# Patient Record
Sex: Female | Born: 1935 | Race: White | Hispanic: No | Smoking: Never smoker
Health system: Southern US, Community
[De-identification: ages and names within clinical notes are randomized; demographics above are authoritative.]

## PROBLEM LIST (undated history)

## (undated) DIAGNOSIS — I10 Essential (primary) hypertension: Secondary | ICD-10-CM

## (undated) DIAGNOSIS — R42 Dizziness and giddiness: Secondary | ICD-10-CM

---

## 2005-01-16 ENCOUNTER — Ambulatory Visit: Payer: Self-pay | Admitting: Internal Medicine

## 2006-01-19 ENCOUNTER — Ambulatory Visit: Payer: Self-pay | Admitting: Internal Medicine

## 2006-04-02 ENCOUNTER — Encounter: Payer: Self-pay | Admitting: Internal Medicine

## 2006-04-10 ENCOUNTER — Encounter: Payer: Self-pay | Admitting: Internal Medicine

## 2007-08-09 ENCOUNTER — Ambulatory Visit: Payer: Self-pay | Admitting: Internal Medicine

## 2008-03-08 ENCOUNTER — Ambulatory Visit: Payer: Self-pay | Admitting: Internal Medicine

## 2008-08-31 ENCOUNTER — Ambulatory Visit: Payer: Self-pay | Admitting: Internal Medicine

## 2009-09-12 ENCOUNTER — Emergency Department: Payer: Self-pay | Admitting: Emergency Medicine

## 2009-09-12 ENCOUNTER — Ambulatory Visit: Payer: Self-pay | Admitting: Internal Medicine

## 2010-04-05 ENCOUNTER — Emergency Department: Payer: Self-pay | Admitting: Emergency Medicine

## 2015-10-14 ENCOUNTER — Emergency Department: Payer: Medicare Other

## 2015-10-14 ENCOUNTER — Observation Stay
Admission: EM | Admit: 2015-10-14 | Discharge: 2015-10-22 | Payer: Medicare Other | Attending: Internal Medicine | Admitting: Internal Medicine

## 2015-10-14 ENCOUNTER — Encounter: Payer: Self-pay | Admitting: Emergency Medicine

## 2015-10-14 DIAGNOSIS — G2401 Drug induced subacute dyskinesia: Secondary | ICD-10-CM | POA: Diagnosis not present

## 2015-10-14 DIAGNOSIS — R410 Disorientation, unspecified: Secondary | ICD-10-CM | POA: Diagnosis not present

## 2015-10-14 DIAGNOSIS — E46 Unspecified protein-calorie malnutrition: Secondary | ICD-10-CM | POA: Diagnosis not present

## 2015-10-14 DIAGNOSIS — G249 Dystonia, unspecified: Secondary | ICD-10-CM | POA: Diagnosis not present

## 2015-10-14 DIAGNOSIS — E785 Hyperlipidemia, unspecified: Secondary | ICD-10-CM | POA: Diagnosis not present

## 2015-10-14 DIAGNOSIS — F329 Major depressive disorder, single episode, unspecified: Secondary | ICD-10-CM | POA: Insufficient documentation

## 2015-10-14 DIAGNOSIS — R4182 Altered mental status, unspecified: Secondary | ICD-10-CM | POA: Insufficient documentation

## 2015-10-14 DIAGNOSIS — R001 Bradycardia, unspecified: Secondary | ICD-10-CM | POA: Insufficient documentation

## 2015-10-14 DIAGNOSIS — R42 Dizziness and giddiness: Secondary | ICD-10-CM | POA: Diagnosis not present

## 2015-10-14 DIAGNOSIS — I371 Nonrheumatic pulmonary valve insufficiency: Secondary | ICD-10-CM | POA: Diagnosis not present

## 2015-10-14 DIAGNOSIS — N390 Urinary tract infection, site not specified: Secondary | ICD-10-CM | POA: Diagnosis present

## 2015-10-14 DIAGNOSIS — E870 Hyperosmolality and hypernatremia: Secondary | ICD-10-CM | POA: Diagnosis not present

## 2015-10-14 DIAGNOSIS — R531 Weakness: Secondary | ICD-10-CM | POA: Diagnosis present

## 2015-10-14 DIAGNOSIS — I4891 Unspecified atrial fibrillation: Secondary | ICD-10-CM | POA: Diagnosis not present

## 2015-10-14 DIAGNOSIS — R5383 Other fatigue: Secondary | ICD-10-CM | POA: Diagnosis not present

## 2015-10-14 DIAGNOSIS — F0390 Unspecified dementia without behavioral disturbance: Secondary | ICD-10-CM | POA: Diagnosis not present

## 2015-10-14 DIAGNOSIS — G9340 Encephalopathy, unspecified: Principal | ICD-10-CM | POA: Diagnosis present

## 2015-10-14 DIAGNOSIS — Z7982 Long term (current) use of aspirin: Secondary | ICD-10-CM | POA: Insufficient documentation

## 2015-10-14 DIAGNOSIS — R339 Retention of urine, unspecified: Secondary | ICD-10-CM | POA: Insufficient documentation

## 2015-10-14 DIAGNOSIS — T50995A Adverse effect of other drugs, medicaments and biological substances, initial encounter: Secondary | ICD-10-CM | POA: Insufficient documentation

## 2015-10-14 DIAGNOSIS — E876 Hypokalemia: Secondary | ICD-10-CM | POA: Insufficient documentation

## 2015-10-14 DIAGNOSIS — R627 Adult failure to thrive: Secondary | ICD-10-CM | POA: Diagnosis not present

## 2015-10-14 DIAGNOSIS — D649 Anemia, unspecified: Secondary | ICD-10-CM | POA: Insufficient documentation

## 2015-10-14 DIAGNOSIS — Z833 Family history of diabetes mellitus: Secondary | ICD-10-CM | POA: Insufficient documentation

## 2015-10-14 DIAGNOSIS — I071 Rheumatic tricuspid insufficiency: Secondary | ICD-10-CM | POA: Insufficient documentation

## 2015-10-14 DIAGNOSIS — I10 Essential (primary) hypertension: Secondary | ICD-10-CM | POA: Diagnosis not present

## 2015-10-14 DIAGNOSIS — I739 Peripheral vascular disease, unspecified: Secondary | ICD-10-CM | POA: Diagnosis not present

## 2015-10-14 DIAGNOSIS — N3000 Acute cystitis without hematuria: Secondary | ICD-10-CM | POA: Insufficient documentation

## 2015-10-14 DIAGNOSIS — I340 Nonrheumatic mitral (valve) insufficiency: Secondary | ICD-10-CM | POA: Insufficient documentation

## 2015-10-14 HISTORY — DX: Essential (primary) hypertension: I10

## 2015-10-14 HISTORY — DX: Dizziness and giddiness: R42

## 2015-10-14 LAB — CBC WITH DIFFERENTIAL/PLATELET
BASOS PCT: 1 %
Basophils Absolute: 0.1 10*3/uL (ref 0–0.1)
Eosinophils Absolute: 0.1 10*3/uL (ref 0–0.7)
Eosinophils Relative: 1 %
HEMATOCRIT: 38.8 % (ref 35.0–47.0)
HEMOGLOBIN: 12.8 g/dL (ref 12.0–16.0)
LYMPHS PCT: 10 %
Lymphs Abs: 1.1 10*3/uL (ref 1.0–3.6)
MCH: 30.6 pg (ref 26.0–34.0)
MCHC: 33.1 g/dL (ref 32.0–36.0)
MCV: 92.4 fL (ref 80.0–100.0)
MONOS PCT: 10 %
Monocytes Absolute: 1 10*3/uL — ABNORMAL HIGH (ref 0.2–0.9)
Neutro Abs: 8.4 10*3/uL — ABNORMAL HIGH (ref 1.4–6.5)
Neutrophils Relative %: 78 %
Platelets: 229 10*3/uL (ref 150–440)
RBC: 4.19 MIL/uL (ref 3.80–5.20)
RDW: 13.4 % (ref 11.5–14.5)
WBC: 10.7 10*3/uL (ref 3.6–11.0)

## 2015-10-14 LAB — COMPREHENSIVE METABOLIC PANEL
ALT: 14 U/L (ref 14–54)
ANION GAP: 9 (ref 5–15)
AST: 25 U/L (ref 15–41)
Albumin: 3.8 g/dL (ref 3.5–5.0)
Alkaline Phosphatase: 56 U/L (ref 38–126)
BILIRUBIN TOTAL: 0.7 mg/dL (ref 0.3–1.2)
BUN: 30 mg/dL — ABNORMAL HIGH (ref 6–20)
CHLORIDE: 103 mmol/L (ref 101–111)
CO2: 26 mmol/L (ref 22–32)
Calcium: 11.5 mg/dL — ABNORMAL HIGH (ref 8.9–10.3)
Creatinine, Ser: 1.13 mg/dL — ABNORMAL HIGH (ref 0.44–1.00)
GFR, EST AFRICAN AMERICAN: 48 mL/min — AB (ref >60)
GFR, EST NON AFRICAN AMERICAN: 42 mL/min — AB (ref >60)
Glucose, Bld: 99 mg/dL (ref 65–99)
POTASSIUM: 4.2 mmol/L (ref 3.5–5.1)
Sodium: 138 mmol/L (ref 135–145)
TOTAL PROTEIN: 7.2 g/dL (ref 6.5–8.1)

## 2015-10-14 LAB — URINE DRUG SCREEN, QUALITATIVE (ARMC ONLY)
Amphetamines, Ur Screen: NOT DETECTED
BENZODIAZEPINE, UR SCRN: NOT DETECTED
Barbiturates, Ur Screen: NOT DETECTED
CANNABINOID 50 NG, UR ~~LOC~~: NOT DETECTED
Cocaine Metabolite,Ur ~~LOC~~: NOT DETECTED
MDMA (Ecstasy)Ur Screen: NOT DETECTED
Methadone Scn, Ur: NOT DETECTED
Opiate, Ur Screen: NOT DETECTED
PHENCYCLIDINE (PCP) UR S: NOT DETECTED
Tricyclic, Ur Screen: NOT DETECTED

## 2015-10-14 LAB — URINALYSIS COMPLETE WITH MICROSCOPIC (ARMC ONLY)
Bilirubin Urine: NEGATIVE
Glucose, UA: NEGATIVE mg/dL
Hgb urine dipstick: NEGATIVE
KETONES UR: NEGATIVE mg/dL
NITRITE: NEGATIVE
PH: 6 (ref 5.0–8.0)
PROTEIN: NEGATIVE mg/dL
SPECIFIC GRAVITY, URINE: 1.016 (ref 1.005–1.030)

## 2015-10-14 LAB — TROPONIN I: TROPONIN I: 0.05 ng/mL — AB (ref <0.031)

## 2015-10-14 LAB — SALICYLATE LEVEL

## 2015-10-14 LAB — ACETAMINOPHEN LEVEL

## 2015-10-14 LAB — ETHANOL

## 2015-10-14 MED ORDER — HYDROCHLOROTHIAZIDE 25 MG PO TABS: 25.0000 mg | ORAL_TABLET | Freq: Every day | ORAL | Status: DC

## 2015-10-14 MED ORDER — HEPARIN SODIUM (PORCINE) 5000 UNIT/ML IJ SOLN
5000.0000 [IU] | Freq: Three times a day (TID) | INTRAMUSCULAR | Status: DC
  Administered 2015-10-15 – 2015-10-22 (×20): 5000 [IU] via SUBCUTANEOUS
  Filled 2015-10-14 (×19): qty 1

## 2015-10-14 MED ORDER — LISINOPRIL-HYDROCHLOROTHIAZIDE 20-12.5 MG PO TABS: 2.0000 | ORAL_TABLET | Freq: Every morning | ORAL | Status: DC

## 2015-10-14 MED ORDER — MONTELUKAST SODIUM 10 MG PO TABS
10.0000 mg | ORAL_TABLET | Freq: Every day | ORAL | Status: DC
  Administered 2015-10-15 – 2015-10-20 (×4): 10 mg via ORAL
  Filled 2015-10-14 (×7): qty 1

## 2015-10-14 MED ORDER — FLUTICASONE PROPIONATE 50 MCG/ACT NA SUSP
1.0000 | Freq: Every day | NASAL | Status: DC
  Administered 2015-10-19 – 2015-10-20 (×2): 2 via NASAL
  Administered 2015-10-21: 1 via NASAL
  Filled 2015-10-14: qty 16

## 2015-10-14 MED ORDER — VITAMIN C 500 MG PO TABS
500.0000 mg | ORAL_TABLET | Freq: Every day | ORAL | Status: DC
  Administered 2015-10-15: 500 mg via ORAL
  Filled 2015-10-14 (×6): qty 1

## 2015-10-14 MED ORDER — PRAVASTATIN SODIUM 10 MG PO TABS
10.0000 mg | ORAL_TABLET | Freq: Every evening | ORAL | Status: DC
  Administered 2015-10-15: 10 mg via ORAL
  Filled 2015-10-14 (×2): qty 1

## 2015-10-14 MED ORDER — ASPIRIN EC 81 MG PO TBEC
81.0000 mg | DELAYED_RELEASE_TABLET | Freq: Every day | ORAL | Status: DC
  Administered 2015-10-15: 81 mg via ORAL
  Filled 2015-10-14 (×6): qty 1

## 2015-10-14 MED ORDER — DEXTROSE 5 % IV SOLN
1.0000 g | Freq: Once | INTRAVENOUS | Status: AC
  Administered 2015-10-14: 1 g via INTRAVENOUS
  Filled 2015-10-14: qty 10

## 2015-10-14 MED ORDER — OXYCODONE HCL 5 MG PO TABS: 5.0000 mg | ORAL_TABLET | ORAL | Status: DC | PRN

## 2015-10-14 MED ORDER — LISINOPRIL 20 MG PO TABS: 40.0000 mg | ORAL_TABLET | Freq: Every day | ORAL | Status: DC

## 2015-10-14 MED ORDER — DEXTROSE 5 % IV SOLN
1.0000 g | INTRAVENOUS | Status: DC
  Administered 2015-10-15 – 2015-10-18 (×4): 1 g via INTRAVENOUS
  Filled 2015-10-14 (×6): qty 10

## 2015-10-14 MED ORDER — SODIUM CHLORIDE 0.9 % IV SOLN
INTRAVENOUS | Status: DC
  Administered 2015-10-14 – 2015-10-17 (×5): via INTRAVENOUS

## 2015-10-14 MED ORDER — SODIUM CHLORIDE 0.9 % IV BOLUS (SEPSIS)
1000.0000 mL | Freq: Once | INTRAVENOUS | Status: AC
  Administered 2015-10-14: 1000 mL via INTRAVENOUS

## 2015-10-14 MED ORDER — ACETAMINOPHEN 650 MG RE SUPP: 650.0000 mg | Freq: Four times a day (QID) | RECTAL | Status: DC | PRN

## 2015-10-14 MED ORDER — MORPHINE SULFATE (PF) 2 MG/ML IV SOLN: 2.0000 mg | INTRAVENOUS | Status: DC | PRN

## 2015-10-14 MED ORDER — ACETAMINOPHEN 325 MG PO TABS: 650.0000 mg | ORAL_TABLET | Freq: Four times a day (QID) | ORAL | Status: DC | PRN

## 2015-10-14 MED ORDER — ONDANSETRON HCL 4 MG PO TABS: 4.0000 mg | ORAL_TABLET | Freq: Four times a day (QID) | ORAL | Status: DC | PRN

## 2015-10-14 MED ORDER — ONDANSETRON HCL 4 MG/2ML IJ SOLN: 4.0000 mg | Freq: Four times a day (QID) | INTRAMUSCULAR | Status: DC | PRN

## 2015-10-14 NOTE — ED Notes (Signed)
Resting quietly at this time.  Family at bedside. 

## 2015-10-14 NOTE — ED Notes (Signed)
Not eating or drinking as well either

## 2015-10-14 NOTE — H&P (Signed)
[facility]   PATIENT NAME: Ellen Evans    MR#:  [id number]  DATE OF BIRTH:  [birth year]   DATE OF ADMISSION:  10/14/2015  PRIMARY CARE PHYSICIAN: Mickey Farber, MD   REQUESTING/REFERRING PHYSICIAN: McShane  CHIEF COMPLAINT:   Chief Complaint  Patient presents with  . Altered Mental Status    per family more confused today and not wanting to walk anywhere. started this am. no other symptoms    HISTORY OF PRESENT ILLNESS:  Ellen Evans  is a 81 y.o. female with a known history of essential hypertension who was presented with altered mental status. The patient is unable to provide meaningful information given mental status/medical condition history obtained from daughter present at bedside. She describes one day duration of altered mental status described as confusion tiredness as well as generalized weakness. She denies any fevers or chills or further symptomatology at this time. In the emergency department noted to be in atrial fibrillation rate controlled PAST MEDICAL HISTORY:   Past Medical History  Diagnosis Date  . Hypertension     PAST SURGICAL HISTORY:  History reviewed. No pertinent past surgical history.  SOCIAL HISTORY:   Social History  Substance Use Topics  . Smoking status: Never Smoker   . Smokeless tobacco: Not on file  . Alcohol Use: No    FAMILY HISTORY:   Family History  Problem Relation Age of Onset  . Diabetes Neg Hx     DRUG ALLERGIES:  No Known Allergies  REVIEW OF SYSTEMS:  Unable to obtain given patient's mental status/medical condition   MEDICATIONS AT HOME:   Prior to Admission medications   Medication Sig Start Date End Date Taking? Authorizing Provider  aspirin EC 81 MG tablet Take 81 mg by mouth daily.    Yes Historical Provider, MD  calcium-vitamin D ([employer] [facility] CALCIUM-VIT D) 500-200 MG-UNIT tablet Take 1 tablet by mouth daily.   Yes Historical Provider, MD  fluticasone  (FLONASE) 50 MCG/ACT nasal spray Place 1-2 sprays into both nostrils daily.   Yes Historical Provider, MD  lisinopril-hydrochlorothiazide (PRINZIDE,ZESTORETIC) 20-12.5 MG tablet Take 2 tablets by mouth every morning.   Yes Historical Provider, MD  montelukast (SINGULAIR) 10 MG tablet Take 10 mg by mouth at bedtime.    Yes Historical Provider, MD  pravastatin (PRAVACHOL) 10 MG tablet Take 10 mg by mouth every evening.    Yes Historical Provider, MD  vitamin C (ASCORBIC ACID) 500 MG tablet Take 500 mg by mouth daily.    Yes Historical Provider, MD      VITAL SIGNS:  Blood pressure 128/93, pulse 86, temperature 97.7 F (36.5 C), temperature source Oral, resp. rate 16, height  (1.575 m), weight 115 lb (52.164 kg), SpO2 98 %.  PHYSICAL EXAMINATION:  VITAL SIGNS: Filed Vitals:   10/14/15 1942 10/14/15 1954  BP: 128/83 128/93  Pulse:  86  Temp:    Resp:  16   GENERAL:81 y.o.female currently in no acute distress. Frail appearing  HEAD: Normocephalic, atraumatic.  EYES: Pupils equal, round, reactive to light. Extraocular muscles intact. No scleral icterus.  MOUTH: Moist mucosal membrane. Dentition intact. No abscess noted.  EAR, NOSE, THROAT: Clear without exudates. No external lesions.  NECK: Supple. No thyromegaly. No nodules. No JVD.  PULMONARY: Clear to ascultation, without wheeze rails or rhonci. No use of accessory muscles, Good respiratory effort. good air entry bilaterally CHEST: Nontender to palpation.  CARDIOVASCULAR: S1 and S2. Irregular rate and  rhythm. No murmurs, rubs, or gallops. No edema. Pedal pulses 2+ bilaterally.  GASTROINTESTINAL: Soft, nontender, nondistended. No masses. Positive bowel sounds. No hepatosplenomegaly.  MUSCULOSKELETAL: No swelling, clubbing, or edema. Range of motion full in all extremities.  NEUROLOGIC: Unable to accurately assess given patient's mental status/medical condition she does have spontaneous movement of all extremities as well as tracking  with IV movements but unable to follow commands  SKIN: No ulceration, lesions, rashes, or cyanosis. Skin warm and dry. Turgor intact.  PSYCHIATRIC: Mood, affect blunted The patient is awake but unable to answer questions or follow commands Insight, judgment poor.    LABORATORY PANEL:   CBC  Recent Labs Lab 10/14/15 1942  WBC 10.7  HGB 12.8  HCT 38.8  PLT 229   ------------------------------------------------------------------------------------------------------------------  Chemistries   Recent Labs Lab 10/14/15 1942  NA 138  K 4.2  CL 103  CO2 26  GLUCOSE 99  BUN 30*  CREATININE 1.13*  CALCIUM 11.5*  AST 25  ALT 14  ALKPHOS 56  BILITOT 0.7   ------------------------------------------------------------------------------------------------------------------  Cardiac Enzymes  Recent Labs Lab 10/14/15 1942  TROPONINI 0.05*   ------------------------------------------------------------------------------------------------------------------  RADIOLOGY:  Dg Chest 2 View  10/14/2015  CLINICAL DATA:  Acute onset of lethargy and decreased appetite. Initial encounter. EXAM: CHEST  2 VIEW COMPARISON:  Chest radiograph performed 09/12/2009 FINDINGS: The lungs are well-aerated. Minimal bilateral atelectasis is noted. There is no evidence of pleural effusion or pneumothorax. The heart is borderline normal in size. No acute osseous abnormalities are seen. IMPRESSION: Minimal bilateral atelectasis noted.  Lungs otherwise clear. Electronically Signed   By: Roanna RaiderJeffery  Chang M.D.   On: 10/14/2015 18:22   Ct Head Wo Contrast  10/14/2015  CLINICAL DATA:  Worsening confusion and poor mobility, acute onset. Initial encounter. EXAM: CT HEAD WITHOUT CONTRAST TECHNIQUE: Contiguous axial images were obtained from the base of the skull through the vertex without intravenous contrast. COMPARISON:  None. FINDINGS: There is no evidence of acute infarction, mass lesion, or intra- or extra-axial  hemorrhage on CT. Prominence of the ventricles and sulci reflects moderate cortical volume loss. Mild cerebellar atrophy is noted. Scattered periventricular and subcortical white matter change likely reflects small vessel ischemic microangiopathy. The brainstem and fourth ventricle are within normal limits. The basal ganglia are unremarkable in appearance. The cerebral hemispheres demonstrate grossly normal gray-white differentiation. No mass effect or midline shift is seen. There is no evidence of fracture; visualized osseous structures are unremarkable in appearance. The visualized portions of the orbits are within normal limits. The paranasal sinuses and mastoid air cells are well-aerated. No significant soft tissue abnormalities are seen. IMPRESSION: 1. No acute intracranial pathology seen on CT. 2. Moderate cortical volume loss and scattered small vessel ischemic microangiopathy. Electronically Signed   By: Roanna RaiderJeffery  Chang M.D.   On: 10/14/2015 19:01    EKG:   Orders placed or performed during the hospital encounter of 10/14/15  . ED EKG  . ED EKG  . EKG 12-Lead  . EKG 12-Lead    IMPRESSION AND PLAN:   81 year old Caucasian female history of essential hypertension presenting with altered mental status  1. Encephalopathy, metabolic: Multifactorial however urinary tract infection site unspecified most likely culprit follow culture data, continue ceftriaxone as started in emergency department as well as noted hypercalcemia 2. Hypercalcemia has had a history of this: We'll hold her calcium supplements as well as provide IV fluid hydration 3. Essential hypertension: Continue lisinopril/hydrochlorothiazide 4. Atrial fibrillation, apparent new onset: Check TSH, trend enzymes, telemetry,  echocardiogram I have discussed risks and benefits of starting anticoagulation feel at this time risk outweigh benefit as she is altered and already unsteady on her feet. Daughter agrees 5. Venous thromboembolism  prophylactic: Heparin subcutaneous    All the records are reviewed and case discussed with ED provider. Management plans discussed with the patient, family and they are in agreement.  CODE STATUS: Full  TOTAL TIME TAKING CARE OF THIS PATIENT: 35 minutes.    Hower,  Mardi Mainland.D on 10/14/2015 at 10:23 PM  Between 7am to 6pm - Pager - [phone]  After 6pm: House Pager: - [phone]  [facility]  Office  [phone]  CC: Primary care physician; Mickey Farber, MD

## 2015-10-14 NOTE — ED Provider Notes (Signed)
[facility] Emergency Department Provider Note  ____________________________________________   I have reviewed the triage vital signs and the nursing notes.   HISTORY  Chief Complaint Altered Mental Status    HPI Ellen Evans is a 81 y.o. female with a history of mild dementia, who is normally quite alert but has been getting gradually weaker over the last several months with increasing confusion has an acute change in her status today apparently. Today, she is much more weak than normal generally, no focal weakness. She has been less talkative than normal, less oriented than normal, and when they walk she will try to sit down. There is been no complaints of fever or vomiting. Patient does not eat or drink very much. There is been no complaint of fall or closed head injury. She actually lives by herself but her daughter his neck or with her. Patient herself cannot give a history.  No past medical history on file.  There are no active problems to display for this patient.   No past surgical history on file.  No current outpatient prescriptions on file.  Allergies Review of patient's allergies indicates no known allergies.  No family history on file.  Social History Social History  Substance Use Topics  . Smoking status: Never Smoker   . Smokeless tobacco: Not on file  . Alcohol Use: No    Review of Systems Cannot obtain second to patient mental status  ____________________________________________   PHYSICAL EXAM:  VITAL SIGNS: ED Triage Vitals  Enc Vitals Group     BP 10/14/15 1626 100/62 mmHg     Pulse Rate 10/14/15 1626 108     Resp 10/14/15 1954 16     Temp 10/14/15 1626 97.7 F (36.5 C)     Temp Source 10/14/15 1626 Oral     SpO2 10/14/15 1626 95 %     Weight 10/14/15 1626 115 lb (52.164 kg)     Height 10/14/15 1626  (1.575 m)     Head Cir --      Peak Flow --      Pain Score --      Pain Loc --      Pain Edu? --    Excl. in GC? --     Constitutional: Alert elderly female who hard of hearing and somewhat confused. She can tell me her name but cannot tell me her daughter's name and does not know where she is or the date Eyes: Conjunctivae are normal. PERRL. EOMI. Head: Atraumatic. Nose: No congestion/rhinnorhea. Mouth/Throat: Mucous membranes are dry.  Oropharynx non-erythematous. Neck: No stridor.   Nontender with no meningismus Cardiovascular: Normal rate, regular rhythm. Grossly normal heart sounds.  Good peripheral circulation. Respiratory: Normal respiratory effort.  No retractions. Lungs CTAB. Abdominal: Soft and nontender. No distention. No guarding no rebound Back:  There is no focal tenderness or step off there is no midline tenderness there are no lesions noted. there is no CVA tenderness Musculoskeletal: No lower extremity tenderness. No joint effusions, no DVT signs strong distal pulses no edema Neurologic:  Normal speech and language. No gross focal neurologic deficits are appreciated.  Skin:  Skin is warm, dry and intact. No rash noted. Psychiatric: Mood and affect are normal. Speech and behavior are normal.  ____________________________________________   LABS (all labs ordered are listed, but only abnormal results are displayed)  Labs Reviewed  COMPREHENSIVE METABOLIC PANEL - Abnormal; Notable for the following:    BUN 30 (*)    Creatinine,  Ser 1.13 (*)    Calcium 11.5 (*)    GFR calc non Af Amer 42 (*)    GFR calc Af Amer 48 (*)    All other components within normal limits  TROPONIN I - Abnormal; Notable for the following:    Troponin I 0.05 (*)    All other components within normal limits  ACETAMINOPHEN LEVEL - Abnormal; Notable for the following:    Acetaminophen (Tylenol), Serum <10 (*)    All other components within normal limits  CBC WITH DIFFERENTIAL/PLATELET - Abnormal; Notable for the following:    Neutro Abs 8.4 (*)    Monocytes Absolute 1.0 (*)    All other  components within normal limits  URINALYSIS COMPLETEWITH MICROSCOPIC ([facility] ONLY) - Abnormal; Notable for the following:    Color, Urine YELLOW (*)    APPearance HAZY (*)    Leukocytes, UA 1+ (*)    Bacteria, UA RARE (*)    Squamous Epithelial / LPF 0-5 (*)    All other components within normal limits  URINE CULTURE  ETHANOL  SALICYLATE LEVEL  URINE DRUG SCREEN, QUALITATIVE ([facility] ONLY)   ____________________________________________  EKG  I personally interpreted any EKGs ordered by me or triage Likely atrial flutter with a 2 to one block rate 83 possibility of heart block is entertained but think not likely. Patient with no acute ST elevation or depression normal axis and nonspecific ST changes ____________________________________________  RADIOLOGY  I reviewed any imaging ordered by me or triage that were performed during my shift ____________________________________________   PROCEDURES  Procedure(s) performed: None  Critical Care performed: None  ____________________________________________   INITIAL IMPRESSION / ASSESSMENT AND PLAN / ED COURSE  Pertinent labs & imaging results that were available during my care of the patient were reviewed by me and considered in my medical decision making (see chart for details).  Patient with increasing confusion over baseline increasing weakness over baseline decreasing by mouth or her baseline, vital signs are reassuring blood work is thus far reassuring, evidence of a urinary tract infection is noted. Troponin is 0.05. She is unable to tell me if she is having chest pain which embolus but there is no acute ischemic changes. EKG shows what appears to be a likely flutter although heart block is possible I do not think it is the correct diagnosis. Patient will require further evaluation for her worsening symptoms as she lives alone and cannot go home and she has had acute change in her mental status. There is a questionable UTI and we  will treat it. ____________________________________________   FINAL CLINICAL IMPRESSION(S) / ED DIAGNOSES  Final diagnoses:  None     Jeanmarie PlantJames A McShane, MD 10/14/15 2116

## 2015-10-14 NOTE — ED Notes (Signed)
Report called to floor, given to [place].

## 2015-10-15 ENCOUNTER — Inpatient Hospital Stay
Admit: 2015-10-15 | Discharge: 2015-10-15 | Disposition: A | Discharge status: Home / Self Care | Payer: Medicare Other | Attending: Internal Medicine | Admitting: Internal Medicine

## 2015-10-15 DIAGNOSIS — R410 Disorientation, unspecified: Secondary | ICD-10-CM | POA: Diagnosis not present

## 2015-10-15 DIAGNOSIS — G9340 Encephalopathy, unspecified: Secondary | ICD-10-CM | POA: Diagnosis present

## 2015-10-15 LAB — BASIC METABOLIC PANEL
Anion gap: 5 (ref 5–15)
BUN: 25 mg/dL — ABNORMAL HIGH (ref 6–20)
CALCIUM: 10 mg/dL (ref 8.9–10.3)
CO2: 26 mmol/L (ref 22–32)
CREATININE: 0.95 mg/dL (ref 0.44–1.00)
Chloride: 109 mmol/L (ref 101–111)
GFR calc non Af Amer: 52 mL/min — ABNORMAL LOW (ref >60)
GFR, EST AFRICAN AMERICAN: 60 mL/min — AB (ref >60)
GLUCOSE: 87 mg/dL (ref 65–99)
Potassium: 3.4 mmol/L — ABNORMAL LOW (ref 3.5–5.1)
Sodium: 140 mmol/L (ref 135–145)

## 2015-10-15 LAB — TROPONIN I
TROPONIN I: 0.04 ng/mL — AB (ref <0.031)
Troponin I: 0.04 ng/mL — ABNORMAL HIGH (ref <0.031)
Troponin I: 0.04 ng/mL — ABNORMAL HIGH (ref <0.031)

## 2015-10-15 LAB — CBC
HCT: 31.9 % — ABNORMAL LOW (ref 35.0–47.0)
Hemoglobin: 10.8 g/dL — ABNORMAL LOW (ref 12.0–16.0)
MCH: 31.1 pg (ref 26.0–34.0)
MCHC: 33.9 g/dL (ref 32.0–36.0)
MCV: 91.8 fL (ref 80.0–100.0)
PLATELETS: 190 10*3/uL (ref 150–440)
RBC: 3.48 MIL/uL — AB (ref 3.80–5.20)
RDW: 13.1 % (ref 11.5–14.5)
WBC: 6.6 10*3/uL (ref 3.6–11.0)

## 2015-10-15 LAB — TSH: TSH: 4.441 u[IU]/mL (ref 0.350–4.500)

## 2015-10-15 MED ORDER — MIRTAZAPINE 15 MG PO TABS
30.0000 mg | ORAL_TABLET | Freq: Every day | ORAL | Status: DC
  Administered 2015-10-15: 30 mg via ORAL
  Filled 2015-10-15: qty 2

## 2015-10-15 NOTE — Progress Notes (Signed)
*  PRELIMINARY RESULTS* Echocardiogram 2D Echocardiogram has been performed.  Georgann HousekeeperJerry R Hege 10/15/2015, 2:07 PM

## 2015-10-15 NOTE — Consult Note (Signed)
[facility] Face-to-Face Psychiatry Consult   Reason for Consult:  Consult for this 81 year old woman who was brought to the hospital by her family because of a couple days of rapid change in mental status. Consult for "major depression" Referring Physician:  Volanda Napoleon Patient Identification: Ellen Evans MRN:  [id number] Principal Diagnosis: Delirium Diagnosis:   Patient Active Problem List   Diagnosis Date Noted  . Encephalopathy acute [G93.40] 10/15/2015  . Acute delirium [R41.0] 10/15/2015  . Encephalopathy [G93.40] 10/14/2015  . UTI (lower urinary tract infection) [N39.0] 10/14/2015  . Atrial fibrillation ([place]) [I48.91] 10/14/2015    Total Time spent with patient: 1 hour  Subjective:   Ellen Evans is a 81 y.o. female patient admitted with patient is not able to give any information on her own.  HPI:  Information from the patient's family and the chart and from observation of the patient. This 81 year old woman was brought to the hospital by her family because of mental status changes. The daughter who is present today is the one who brought her in. She says that over the course of just one day there was a dramatic change in her mother's behavior. She started acting more confused. At baseline she is able to feed herself independently and suddenly she was not able to do it. At baseline she is able to do her own toileting and suddenly she was not able to do it. She is never very conversant but she seemed to become even more confused and less talkative than usual. It was observed by other treatment team members that the patient was confused and very uncommunicative earlier. Possible consideration of depression as an etiology. The daughter and granddaughter who are present note that the patient has seemed to be dwindling over the last few years since her husband passed away. Also that at times she seems a little bit down but they don't have any specific evidence of sadness or tearfulness there is no talk  about suicidal ideation.  Social history: Patient actually lives independently but with immediate family next-door who check in on her regularly. Husband died a few years ago. Supportive family.  Medical history: History of atrial fibrillation urinary tract infections but overall it sounds like she tends not to patronize medical care very much. The daughter describes a situation that occurred earlier this year in which the patient began to have very low blood pressure. The daughter made the connection that the lower her blood pressure was the more delirious her mother would be. When blood pressure medicines were cut back her mental status improved dramatically.  Substance abuse history: No history of current or past substance abuse  Past Psychiatric History: No history of past psychiatric problems. Never been seen by a psychiatrist never prescribed any medicine for psychiatric needs never been in a psychiatric hospital no history of suicidality.  Risk to Self: Is patient at risk for suicide?: No Risk to Others:   Prior Inpatient Therapy:   Prior Outpatient Therapy:    Past Medical History:  Past Medical History  Diagnosis Date  . Hypertension   . Vertigo    History reviewed. No pertinent past surgical history. Family History:  Family History  Problem Relation Age of Onset  . Diabetes Neg Hx    Family Psychiatric  History: No known family history of mental health or substance abuse problems Social History:  History  Alcohol Use No     History  Drug Use Not on file    Social History   Social History  .  Marital Status: Widowed    Spouse Name: N/A  . Number of Children: N/A  . Years of Education: N/A   Social History Main Topics  . Smoking status: Never Smoker   . Smokeless tobacco: None  . Alcohol Use: No  . Drug Use: None  . Sexual Activity: Not Asked   Other Topics Concern  . None   Social History Narrative  . None   Additional Social History:                           Allergies:  No Known Allergies  Labs:  Results for orders placed or performed during the hospital encounter of 10/14/15 (from the past 48 hour(s))  Urine culture     Status: None (Preliminary result)   Collection Time: 10/14/15  5:45 PM  Result Value Ref Range   Specimen Description URINE, RANDOM    Special Requests NONE    Culture TOO YOUNG TO READ    Report Status PENDING   Urinalysis complete, with microscopic     Status: Abnormal   Collection Time: 10/14/15  5:45 PM  Result Value Ref Range   Color, Urine YELLOW (A) YELLOW   APPearance HAZY (A) CLEAR   Glucose, UA NEGATIVE NEGATIVE mg/dL   Bilirubin Urine NEGATIVE NEGATIVE   Ketones, ur NEGATIVE NEGATIVE mg/dL   Specific Gravity, Urine 1.016 1.005 - 1.030   Hgb urine dipstick NEGATIVE NEGATIVE   pH 6.0 5.0 - 8.0   Protein, ur NEGATIVE NEGATIVE mg/dL   Nitrite NEGATIVE NEGATIVE   Leukocytes, UA 1+ (A) NEGATIVE   RBC / HPF 0-5 0 - 5 RBC/hpf   WBC, UA 6-30 0 - 5 WBC/hpf   Bacteria, UA RARE (A) NONE SEEN   Squamous Epithelial / LPF 0-5 (A) NONE SEEN   Mucous PRESENT    Hyaline Casts, UA PRESENT   Urine Drug Screen, Qualitative     Status: None   Collection Time: 10/14/15  5:45 PM  Result Value Ref Range   Tricyclic, Ur Screen NONE DETECTED NONE DETECTED   Amphetamines, Ur Screen NONE DETECTED NONE DETECTED   MDMA (Ecstasy)Ur Screen NONE DETECTED NONE DETECTED   Cocaine Metabolite,Ur [place] NONE DETECTED NONE DETECTED   Opiate, Ur Screen NONE DETECTED NONE DETECTED   Phencyclidine (PCP) Ur S NONE DETECTED NONE DETECTED   Cannabinoid 50 Ng, Ur [place] NONE DETECTED NONE DETECTED   Barbiturates, Ur Screen NONE DETECTED NONE DETECTED   Benzodiazepine, Ur Scrn NONE DETECTED NONE DETECTED   Methadone Scn, Ur NONE DETECTED NONE DETECTED    Comment: (NOTE) 614  Tricyclics, urine               Cutoff 1000 ng/mL 200  Amphetamines, urine             Cutoff 1000 ng/mL 300  MDMA (Ecstasy), urine           Cutoff 500  ng/mL 400  Cocaine Metabolite, urine       Cutoff 300 ng/mL 500  Opiate, urine                   Cutoff 300 ng/mL 600  Phencyclidine (PCP), urine      Cutoff 25 ng/mL 700  Cannabinoid, urine              Cutoff 50 ng/mL 800  Barbiturates, urine             Cutoff 200 ng/mL 900  Benzodiazepine, urine           Cutoff 200 ng/mL 1000 Methadone, urine                Cutoff 300 ng/mL 1100 1200 The urine drug screen provides only a preliminary, unconfirmed 1300 analytical test result and should not be used for non-medical 1400 purposes. Clinical consideration and professional judgment should 1500 be applied to any positive drug screen result due to possible 1600 interfering substances. A more specific alternate chemical method 1700 must be used in order to obtain a confirmed analytical result.  1800 Gas chromato graphy / mass spectrometry (GC/MS) is the preferred 1900 confirmatory method.   Comprehensive metabolic panel     Status: Abnormal   Collection Time: 10/14/15  7:42 PM  Result Value Ref Range   Sodium 138 135 - 145 mmol/L   Potassium 4.2 3.5 - 5.1 mmol/L    Comment: HEMOLYSIS AT THIS LEVEL MAY AFFECT RESULT   Chloride 103 101 - 111 mmol/L   CO2 26 22 - 32 mmol/L   Glucose, Bld 99 65 - 99 mg/dL   BUN 30 (H) 6 - 20 mg/dL   Creatinine, Ser 1.13 (H) 0.44 - 1.00 mg/dL   Calcium 11.5 (H) 8.9 - 10.3 mg/dL   Total Protein 7.2 6.5 - 8.1 g/dL   Albumin 3.8 3.5 - 5.0 g/dL   AST 25 15 - 41 U/L   ALT 14 14 - 54 U/L   Alkaline Phosphatase 56 38 - 126 U/L   Total Bilirubin 0.7 0.3 - 1.2 mg/dL   GFR calc non Af Amer 42 (L) >60 mL/min   GFR calc Af Amer 48 (L) >60 mL/min    Comment: (NOTE) The eGFR has been calculated using the CKD EPI equation. This calculation has not been validated in all clinical situations. eGFR's persistently <60 mL/min signify possible Chronic Kidney Disease.    Anion gap 9 5 - 15  Ethanol     Status: None   Collection Time: 10/14/15  7:42 PM  Result Value Ref  Range   Alcohol, Ethyl (B) <5 <5 mg/dL    Comment:        LOWEST DETECTABLE LIMIT FOR SERUM ALCOHOL IS 5 mg/dL FOR MEDICAL PURPOSES ONLY   Troponin I     Status: Abnormal   Collection Time: 10/14/15  7:42 PM  Result Value Ref Range   Troponin I 0.05 (H) <0.031 ng/mL    Comment: READ BACK AND VERIFIED WITH Mali VANDYKE ON 10/14/15 AT 2047PM BY TB.        PERSISTENTLY INCREASED TROPONIN VALUES IN THE RANGE OF 0.04-0.49 ng/mL CAN BE SEEN IN:       -UNSTABLE ANGINA       -CONGESTIVE HEART FAILURE       -MYOCARDITIS       -CHEST TRAUMA       -ARRYHTHMIAS       -LATE PRESENTING MYOCARDIAL INFARCTION       -COPD   CLINICAL FOLLOW-UP RECOMMENDED.   Salicylate level     Status: None   Collection Time: 10/14/15  7:42 PM  Result Value Ref Range   Salicylate Lvl <5.3 2.8 - 30.0 mg/dL  Acetaminophen level     Status: Abnormal   Collection Time: 10/14/15  7:42 PM  Result Value Ref Range   Acetaminophen (Tylenol), Serum <10 (L) 10 - 30 ug/mL    Comment:        THERAPEUTIC CONCENTRATIONS VARY SIGNIFICANTLY. A RANGE OF  10-30 ug/mL MAY BE AN EFFECTIVE CONCENTRATION FOR MANY PATIENTS. HOWEVER, SOME ARE BEST TREATED AT CONCENTRATIONS OUTSIDE THIS RANGE. ACETAMINOPHEN CONCENTRATIONS >150 ug/mL AT 4 HOURS AFTER INGESTION AND >50 ug/mL AT 12 HOURS AFTER INGESTION ARE OFTEN ASSOCIATED WITH TOXIC REACTIONS.   CBC with Differential     Status: Abnormal   Collection Time: 10/14/15  7:42 PM  Result Value Ref Range   WBC 10.7 3.6 - 11.0 K/uL   RBC 4.19 3.80 - 5.20 MIL/uL   Hemoglobin 12.8 12.0 - 16.0 g/dL   HCT 38.8 35.0 - 47.0 %   MCV 92.4 80.0 - 100.0 fL   MCH 30.6 26.0 - 34.0 pg   MCHC 33.1 32.0 - 36.0 g/dL   RDW 13.4 11.5 - 14.5 %   Platelets 229 150 - 440 K/uL   Neutrophils Relative % 78 %   Neutro Abs 8.4 (H) 1.4 - 6.5 K/uL   Lymphocytes Relative 10 %   Lymphs Abs 1.1 1.0 - 3.6 K/uL   Monocytes Relative 10 %   Monocytes Absolute 1.0 (H) 0.2 - 0.9 K/uL   Eosinophils Relative  1 %   Eosinophils Absolute 0.1 0 - 0.7 K/uL   Basophils Relative 1 %   Basophils Absolute 0.1 0 - 0.1 K/uL  Troponin I (q 6hr x 3)     Status: Abnormal   Collection Time: 10/15/15 12:12 AM  Result Value Ref Range   Troponin I 0.04 (H) <0.031 ng/mL    Comment: PREVIOUS RESULT CALLED BY TB AT 2047 10/14/15 WDM        PERSISTENTLY INCREASED TROPONIN VALUES IN THE RANGE OF 0.04-0.49 ng/mL CAN BE SEEN IN:       -UNSTABLE ANGINA       -CONGESTIVE HEART FAILURE       -MYOCARDITIS       -CHEST TRAUMA       -ARRYHTHMIAS       -LATE PRESENTING MYOCARDIAL INFARCTION       -COPD   CLINICAL FOLLOW-UP RECOMMENDED.   TSH     Status: None   Collection Time: 10/15/15 12:12 AM  Result Value Ref Range   TSH 4.441 0.350 - 4.500 uIU/mL  Basic metabolic panel     Status: Abnormal   Collection Time: 10/15/15  5:37 AM  Result Value Ref Range   Sodium 140 135 - 145 mmol/L   Potassium 3.4 (L) 3.5 - 5.1 mmol/L   Chloride 109 101 - 111 mmol/L   CO2 26 22 - 32 mmol/L   Glucose, Bld 87 65 - 99 mg/dL   BUN 25 (H) 6 - 20 mg/dL   Creatinine, Ser 0.95 0.44 - 1.00 mg/dL   Calcium 10.0 8.9 - 10.3 mg/dL   GFR calc non Af Amer 52 (L) >60 mL/min   GFR calc Af Amer 60 (L) >60 mL/min    Comment: (NOTE) The eGFR has been calculated using the CKD EPI equation. This calculation has not been validated in all clinical situations. eGFR's persistently <60 mL/min signify possible Chronic Kidney Disease.    Anion gap 5 5 - 15  CBC     Status: Abnormal   Collection Time: 10/15/15  5:37 AM  Result Value Ref Range   WBC 6.6 3.6 - 11.0 K/uL   RBC 3.48 (L) 3.80 - 5.20 MIL/uL   Hemoglobin 10.8 (L) 12.0 - 16.0 g/dL   HCT 31.9 (L) 35.0 - 47.0 %   MCV 91.8 80.0 - 100.0 fL   MCH 31.1 26.0 -  34.0 pg   MCHC 33.9 32.0 - 36.0 g/dL   RDW 13.1 11.5 - 14.5 %   Platelets 190 150 - 440 K/uL  Troponin I (q 6hr x 3)     Status: Abnormal   Collection Time: 10/15/15  5:37 AM  Result Value Ref Range   Troponin I 0.04 (H) <0.031  ng/mL    Comment: PREVIOUS RESULT CALLED TO Mali VANDYKE ON 10/14/15 AT 2047 BY TB/SRC        PERSISTENTLY INCREASED TROPONIN VALUES IN THE RANGE OF 0.04-0.49 ng/mL CAN BE SEEN IN:       -UNSTABLE ANGINA       -CONGESTIVE HEART FAILURE       -MYOCARDITIS       -CHEST TRAUMA       -ARRYHTHMIAS       -LATE PRESENTING MYOCARDIAL INFARCTION       -COPD   CLINICAL FOLLOW-UP RECOMMENDED.   Troponin I (q 6hr x 3)     Status: Abnormal   Collection Time: 10/15/15 12:06 PM  Result Value Ref Range   Troponin I 0.04 (H) <0.031 ng/mL    Comment: PREVIOUS RESULT CALLED TO Mali VANDYKE ON 10/14/15 AT 2047 BY TB/SRC        PERSISTENTLY INCREASED TROPONIN VALUES IN THE RANGE OF 0.04-0.49 ng/mL CAN BE SEEN IN:       -UNSTABLE ANGINA       -CONGESTIVE HEART FAILURE       -MYOCARDITIS       -CHEST TRAUMA       -ARRYHTHMIAS       -LATE PRESENTING MYOCARDIAL INFARCTION       -COPD   CLINICAL FOLLOW-UP RECOMMENDED.     Current Facility-Administered Medications  Medication Dose Route Frequency Provider Last Rate Last Dose  . 0.9 %  sodium chloride infusion   Intravenous Continuous Lytle Butte, MD 75 mL/hr at 10/15/15 0550    . acetaminophen (TYLENOL) tablet 650 mg  650 mg Oral Q6H PRN Lytle Butte, MD       Or  . acetaminophen (TYLENOL) suppository 650 mg  650 mg Rectal Q6H PRN Lytle Butte, MD      . aspirin EC tablet 81 mg  81 mg Oral Daily Lytle Butte, MD   81 mg at 10/15/15 1040  . cefTRIAXone (ROCEPHIN) 1 g in dextrose 5 % 50 mL IVPB  1 g Intravenous Q24H Lytle Butte, MD   1 g at 10/15/15 0145  . fluticasone (FLONASE) 50 MCG/ACT nasal spray 1-2 spray  1-2 spray Each Nare Daily Lytle Butte, MD   1 spray at 10/15/15 1000  . heparin injection 5,000 Units  5,000 Units Subcutaneous 3 times per day Lytle Butte, MD   5,000 Units at 10/15/15 0600  . mirtazapine (REMERON) tablet 30 mg  30 mg Oral QHS Aldean Jewett, MD      . montelukast (SINGULAIR) tablet 10 mg  10 mg Oral QHS Lytle Butte, MD   10 mg at 10/15/15 0012  . ondansetron (ZOFRAN) tablet 4 mg  4 mg Oral Q6H PRN Lytle Butte, MD       Or  . ondansetron [facility]) injection 4 mg  4 mg Intravenous Q6H PRN Lytle Butte, MD      . pravastatin (PRAVACHOL) tablet 10 mg  10 mg Oral QPM Lytle Butte, MD   10 mg at 10/15/15 1800  . vitamin C (ASCORBIC ACID) tablet 500  mg  500 mg Oral Daily Lytle Butte, MD   500 mg at 10/15/15 1040    Musculoskeletal: Strength & Muscle Tone: decreased Gait & Station: unable to stand Patient leans: N/A  Psychiatric Specialty Exam: Review of Systems  Unable to perform ROS: mental acuity    Blood pressure 95/49, pulse 63, temperature 98.3 F (36.8 C), temperature source Oral, resp. rate 18, height _0  (1.575 m), weight 52.617 kg (116 lb), SpO2 98 %.Body mass index is 21.21 kg/(m^2).  General Appearance: Disheveled  Eye Contact::  Minimal  Speech:  Garbled, Slow and Slurred  Volume:  Decreased  Mood:  Euthymic  Affect:  Flat  Thought Process:  Disorganized and Tangential  Orientation:  Negative  Thought Content:  Negative  Suicidal Thoughts:  No  Homicidal Thoughts:  No  Memory:  Immediate;   Poor Recent;   Poor Remote;   Poor  Judgement:  Impaired  Insight:  Lacking  Psychomotor Activity:  Decreased and Mannerisms  Concentration:  Poor  Recall:  Poor  Fund of Knowledge:Poor  Language: Poor  Akathisia:  Negative  Handed:  Right  AIMS (if indicated):     Assets:  Resilience Social Support  ADL's:  Impaired  Cognition: Impaired,  Moderate  Sleep:      Treatment Plan Summary: Plan This is an 81 year old woman with no past psychiatric history. The time course they're describing of acute onset of confusion and inability to do basic ADLs is not consistent really with major depression. Although they talk about how she had been dwindling and seemed down at times there is not a history that would be clear for her depression. Patient is currently very confused. Doesn't  know where she is. Picking at her clothing around her. The family report that this is still not her baseline but is improved over where she was earlier today. I think this is primarily a delirium. Related to any one of a number of possible medical illnesses. Compounded by hospital confusion. I would not presume to add any antidepressant medicine. As long as she is not getting more agitated I would also not add any antipsychotic. I don't want to risk making things worse for her or causing unnecessary side effects. All of this was explained to the family who are completely understanding of it. I will follow up tomorrow.  Disposition: Patient does not meet criteria for psychiatric inpatient admission.  Kelina Beauchamp 10/15/2015 8:34 PM

## 2015-10-15 NOTE — Progress Notes (Addendum)
Nonverbal. Pt refused to take meds. Family at the bedside. ECHO completed. Refused to take meds. Incontinent. Pt has not showed any signs of pain. No further concerns at this time.

## 2015-10-15 NOTE — Care Management (Signed)
Spoke with patient's daughter Ellen Evans  and granddaughter.  Patient has had increasing confusion over the past few months.  Family has an appointment for dementia work up 12/13.  Patient is currently open to a home health agency that is providing physical therapy and nursing but not sure of name of agency.  Kathy stays with patient during the day.  Patient is alone at night but daughter Ellen Evans who lives next door check on patient around 3:30 AM.  Physical therapy and psych consults are pending.  Discussed concern that patient with dementia- that is getting more confused most likely should not be left alone.  Kathy verbalizes understanding.

## 2015-10-15 NOTE — Care Management Important Message (Signed)
Important Message  Patient Details  Name: Ellen Evans MRN: [id number] Date of Birth: 03-09-1924   Medicare Important Message Given:  Yes    Orel Cooler A, RN 10/15/2015, 9:56 AM

## 2015-10-15 NOTE — BH Assessment (Signed)
Received phone call for Psych Consult. Information forwarded to on call Psychiatrist (Dr. Clapacs). 

## 2015-10-15 NOTE — Progress Notes (Signed)
Patient arrived to 2A Room 259. Patient remains confused, but cooperative and all questions from daughter answered. Patient and family oriented to unit and Fall Safety Plan signed by daughter due to patient's mental status. Skin assessment completed with Avel SensorAdrienne RN. Nursing staff will continue to monitor. Lamonte RicherKara A Jkai Arwood, RN

## 2015-10-15 NOTE — Progress Notes (Signed)
[facility] [place] at [facility]   PATIENT NAME: Ellen Evans    MR#:  [id number]  DATE OF BIRTH:  1924/07/06  SUBJECTIVE:  CHIEF COMPLAINT:   Chief Complaint  Patient presents with  . Altered Mental Status    per family more confused today and not wanting to walk anywhere. started this am. no other symptoms   Seen twice today once in the morning and once in the evening. Family reports that she is more interactive in the afternoon. She was reluctant to speak to me on both occasions. Remained curled up in the bed refuse to move or participates in examination.  REVIEW OF SYSTEMS:   ROS unable to obtain  DRUG ALLERGIES:  No Known Allergies  VITALS:  Blood pressure 105/68, pulse 60, temperature 97.9 F (36.6 C), temperature source Oral, resp. rate 18, height  (1.575 m), weight 52.617 kg (116 lb), SpO2 97 %.  PHYSICAL EXAMINATION:  GENERAL:  81 y.o.-year-old patient lying on her right side, no distress EYES: Holds eyes closed  HEENT: Head atraumatic, normocephalic. Oropharynx and nasopharynx clear.  NECK:  Supple, no jugular venous distention. No thyroid enlargement, no tenderness.  LUNGS: Normal breath sounds bilaterally, no wheezing, rales,rhonchi or crepitation. No use of accessory muscles of respiration.  CARDIOVASCULAR: S1, S2 normal. No murmurs, rubs, or gallops.  ABDOMEN: Soft, nontender, nondistended. Bowel sounds present. No organomegaly or mass.  EXTREMITIES: No pedal edema, cyanosis, or clubbing.  NEUROLOGIC: Refuses to participate in examination, moves all 4 extremities spontaneously  PSYCHIATRIC: Refuses to participate in examination, SKIN: No obvious rash, lesion, or ulcer.    LABORATORY PANEL:   CBC  Recent Labs Lab 10/15/15 0537  WBC 6.6  HGB 10.8*  HCT 31.9*  PLT 190   ------------------------------------------------------------------------------------------------------------------  Chemistries   Recent Labs Lab  10/14/15 1942 10/15/15 0537  NA 138 140  K 4.2 3.4*  CL 103 109  CO2 26 26  GLUCOSE 99 87  BUN 30* 25*  CREATININE 1.13* 0.95  CALCIUM 11.5* 10.0  AST 25  --   ALT 14  --   ALKPHOS 56  --   BILITOT 0.7  --    ------------------------------------------------------------------------------------------------------------------  Cardiac Enzymes  Recent Labs Lab 10/15/15 1206  TROPONINI 0.04*   ------------------------------------------------------------------------------------------------------------------  RADIOLOGY:  Dg Chest 2 View  10/14/2015  CLINICAL DATA:  Acute onset of lethargy and decreased appetite. Initial encounter. EXAM: CHEST  2 VIEW COMPARISON:  Chest radiograph performed 09/12/2009 FINDINGS: The lungs are well-aerated. Minimal bilateral atelectasis is noted. There is no evidence of pleural effusion or pneumothorax. The heart is borderline normal in size. No acute osseous abnormalities are seen. IMPRESSION: Minimal bilateral atelectasis noted.  Lungs otherwise clear. Electronically Signed   By: Roanna Raider M.D.   On: 10/14/2015 18:22   Ct Head Wo Contrast  10/14/2015  CLINICAL DATA:  Worsening confusion and poor mobility, acute onset. Initial encounter. EXAM: CT HEAD WITHOUT CONTRAST TECHNIQUE: Contiguous axial images were obtained from the base of the skull through the vertex without intravenous contrast. COMPARISON:  None. FINDINGS: There is no evidence of acute infarction, mass lesion, or intra- or extra-axial hemorrhage on CT. Prominence of the ventricles and sulci reflects moderate cortical volume loss. Mild cerebellar atrophy is noted. Scattered periventricular and subcortical white matter change likely reflects small vessel ischemic microangiopathy. The brainstem and fourth ventricle are within normal limits. The basal ganglia are unremarkable in appearance. The cerebral hemispheres demonstrate grossly normal gray-white differentiation. No mass effect  or midline  shift is seen. There is no evidence of fracture; visualized osseous structures are unremarkable in appearance. The visualized portions of the orbits are within normal limits. The paranasal sinuses and mastoid air cells are well-aerated. No significant soft tissue abnormalities are seen. IMPRESSION: 1. No acute intracranial pathology seen on CT. 2. Moderate cortical volume loss and scattered small vessel ischemic microangiopathy. Electronically Signed   By: Roanna RaiderJeffery  Chang M.D.   On: 10/14/2015 19:01    EKG:   Orders placed or performed during the hospital encounter of 10/14/15  . ED EKG  . ED EKG  . EKG 12-Lead  . EKG 12-Lead    ASSESSMENT AND PLAN:   1. Altered mental status: Looking back through the notes this has been present on and off since at least October. She has been following with a neurologist. She likely has underlying dementia. There is also concern for depression. She has a possible UTI which would contribute to increasing confusion. I will ask for psychiatric consultation. There are no focal defects to suggest a stroke. CT scan is negative. Discontinue opiates and other sedating medications. Start mirtazapine.  2. Urinary tract infection: Culture is pending. Continue Rocephin.  3. Hypertension: Hold antihypertensives as blood pressure is low.     All the records are reviewed and case discussed with Care Management/Social Workerr. Management plans discussed with the patient, family and they are in agreement.  CODE STATUS: Full   TOTAL TIME TAKING CARE OF THIS PATIENT: 40 minutes.  Greater than 50% of time spent in care coordination and counseling. POSSIBLE D/C IN 2 DAYS, DEPENDING ON CLINICAL CONDITION.   Elby ShowersWALSH, Kanden Carey M.D on 10/15/2015 at 4:55 PM  Between 7am to 6pm - Pager - [phone]  After 6pm go to [email or web address] - password [email or web address] [facility]  [facility]  Office  [phone]  CC: Primary care physician; Mickey FarberHIES, DAVID, MD

## 2015-10-16 DIAGNOSIS — R410 Disorientation, unspecified: Secondary | ICD-10-CM | POA: Diagnosis not present

## 2015-10-16 LAB — BLOOD GAS, ARTERIAL
ACID-BASE DEFICIT: 1.3 mmol/L (ref 0.0–2.0)
Allens test (pass/fail): POSITIVE — AB
Bicarbonate: 22 mEq/L (ref 21.0–28.0)
FIO2: 21
O2 SAT: 95 %
PCO2 ART: 31 mmHg — AB (ref 32.0–48.0)
PO2 ART: 71 mmHg — AB (ref 83.0–108.0)
Patient temperature: 37
pH, Arterial: 7.46 — ABNORMAL HIGH (ref 7.350–7.450)

## 2015-10-16 LAB — URINE CULTURE

## 2015-10-16 LAB — BASIC METABOLIC PANEL
ANION GAP: 4 — AB (ref 5–15)
BUN: 17 mg/dL (ref 6–20)
CALCIUM: 9 mg/dL (ref 8.9–10.3)
CO2: 22 mmol/L (ref 22–32)
Chloride: 114 mmol/L — ABNORMAL HIGH (ref 101–111)
Creatinine, Ser: 0.94 mg/dL (ref 0.44–1.00)
GFR, EST NON AFRICAN AMERICAN: 52 mL/min — AB (ref >60)
Glucose, Bld: 78 mg/dL (ref 65–99)
POTASSIUM: 3 mmol/L — AB (ref 3.5–5.1)
SODIUM: 140 mmol/L (ref 135–145)

## 2015-10-16 LAB — URINALYSIS COMPLETE WITH MICROSCOPIC (ARMC ONLY)
BACTERIA UA: NONE SEEN
Bilirubin Urine: NEGATIVE
GLUCOSE, UA: NEGATIVE mg/dL
HGB URINE DIPSTICK: NEGATIVE
Ketones, ur: NEGATIVE mg/dL
LEUKOCYTES UA: NEGATIVE
NITRITE: NEGATIVE
PH: 5 (ref 5.0–8.0)
Protein, ur: NEGATIVE mg/dL
SPECIFIC GRAVITY, URINE: 1.008 (ref 1.005–1.030)
Squamous Epithelial / LPF: NONE SEEN

## 2015-10-16 LAB — AMMONIA: Ammonia: 10 umol/L (ref 9–35)

## 2015-10-16 LAB — CBC
HCT: 26.6 % — ABNORMAL LOW (ref 35.0–47.0)
Hemoglobin: 8.4 g/dL — ABNORMAL LOW (ref 12.0–16.0)
MCH: 30.4 pg (ref 26.0–34.0)
MCHC: 31.7 g/dL — ABNORMAL LOW (ref 32.0–36.0)
MCV: 95.7 fL (ref 80.0–100.0)
PLATELETS: 144 10*3/uL — AB (ref 150–440)
RBC: 2.78 MIL/uL — AB (ref 3.80–5.20)
RDW: 13.9 % (ref 11.5–14.5)
WBC: 4.3 10*3/uL (ref 3.6–11.0)

## 2015-10-16 MED ORDER — HALOPERIDOL LACTATE 5 MG/ML IJ SOLN
0.5000 mg | Freq: Four times a day (QID) | INTRAMUSCULAR | Status: DC | PRN
  Administered 2015-10-16: 0.5 mg via INTRAVENOUS
  Filled 2015-10-16: qty 1

## 2015-10-16 MED ORDER — HALOPERIDOL LACTATE 5 MG/ML IJ SOLN
5.0000 mg | Freq: Once | INTRAMUSCULAR | Status: AC
  Administered 2015-10-16: 5 mg via INTRAVENOUS

## 2015-10-16 MED ORDER — HALOPERIDOL LACTATE 5 MG/ML IJ SOLN
INTRAMUSCULAR | Status: AC
  Filled 2015-10-16: qty 1

## 2015-10-16 MED ORDER — LORAZEPAM 2 MG/ML IJ SOLN
1.0000 mg | Freq: Four times a day (QID) | INTRAMUSCULAR | Status: DC | PRN
  Administered 2015-10-16: 1 mg via INTRAVENOUS
  Filled 2015-10-16: qty 1

## 2015-10-16 MED ORDER — POTASSIUM CHLORIDE CRYS ER 20 MEQ PO TBCR
40.0000 meq | EXTENDED_RELEASE_TABLET | ORAL | Status: AC
  Administered 2015-10-16: 40 meq via ORAL
  Filled 2015-10-16 (×2): qty 2

## 2015-10-16 NOTE — Consult Note (Signed)
[facility] Consult   Reason for Consult:  Follow-up consult for this 81 year old woman in the hospital with urinary tract infection and delirium Referring Physician:  Volanda Napoleon Patient Identification: Ellen Evans MRN:  [id number] Principal Diagnosis: Delirium Diagnosis:   Patient Active Problem List   Diagnosis Date Noted  . Encephalopathy acute [G93.40] 10/15/2015  . Acute delirium [R41.0] 10/15/2015  . Encephalopathy [G93.40] 10/14/2015  . UTI (lower urinary tract infection) [N39.0] 10/14/2015  . Atrial fibrillation ([place]) [I48.91] 10/14/2015    Total Time spent with patient: 30 minutes  Subjective:   Ellen Evans is a 81 y.o. female patient admitted with patient is unable to offer any complaints at this time. Family reports that she has been sedated all day today. They have not been able to get her to wake up enough to eat any food. She has remained confused and delirious throughout the day worse than she was yesterday.Marland Kitchen  HPI:  Apparently the patient was agitated last night and also last night was started on 30 mg of mirtazapine which she had not been on previously. Has also been given single doses of 5 mg of intravenous Haldol and 1 mg of Ativan over the course of the last day. Today she is sedated all day long. More confused than she was previously. Not eating well.  Past Psychiatric History: No past psychiatric history no history of depression preceding the acute illness  Risk to Self: Is patient at risk for suicide?: No Risk to Others:   Prior Inpatient Therapy:   Prior Outpatient Therapy:    Past Medical History:  Past Medical History  Diagnosis Date  . Hypertension   . Vertigo    History reviewed. No pertinent past surgical history. Family History:  Family History  Problem Relation Age of Onset  . Diabetes Neg Hx    Family Psychiatric  History: Nonidentified of significance Social History:  History  Alcohol Use No     History  Drug Use Not on file     Social History   Social History  . Marital Status: Widowed    Spouse Name: N/A  . Number of Children: N/A  . Years of Education: N/A   Social History Main Topics  . Smoking status: Never Smoker   . Smokeless tobacco: None  . Alcohol Use: No  . Drug Use: None  . Sexual Activity: Not Asked   Other Topics Concern  . None   Social History Narrative  . None   Additional Social History:                          Allergies:  No Known Allergies  Labs:  Results for orders placed or performed during the hospital encounter of 10/14/15 (from the past 48 hour(s))  Comprehensive metabolic panel     Status: Abnormal   Collection Time: 10/14/15  7:42 PM  Result Value Ref Range   Sodium 138 135 - 145 mmol/L   Potassium 4.2 3.5 - 5.1 mmol/L    Comment: HEMOLYSIS AT THIS LEVEL MAY AFFECT RESULT   Chloride 103 101 - 111 mmol/L   CO2 26 22 - 32 mmol/L   Glucose, Bld 99 65 - 99 mg/dL   BUN 30 (H) 6 - 20 mg/dL   Creatinine, Ser 1.13 (H) 0.44 - 1.00 mg/dL   Calcium 11.5 (H) 8.9 - 10.3 mg/dL   Total Protein 7.2 6.5 - 8.1 g/dL   Albumin 3.8 3.5 - 5.0 g/dL  AST 25 15 - 41 U/L   ALT 14 14 - 54 U/L   Alkaline Phosphatase 56 38 - 126 U/L   Total Bilirubin 0.7 0.3 - 1.2 mg/dL   GFR calc non Af Amer 42 (L) >60 mL/min   GFR calc Af Amer 48 (L) >60 mL/min    Comment: (NOTE) The eGFR has been calculated using the CKD EPI equation. This calculation has not been validated in all clinical situations. eGFR's persistently <60 mL/min signify possible Chronic Kidney Disease.    Anion gap 9 5 - 15  Ethanol     Status: None   Collection Time: 10/14/15  7:42 PM  Result Value Ref Range   Alcohol, Ethyl (B) <5 <5 mg/dL    Comment:        LOWEST DETECTABLE LIMIT FOR SERUM ALCOHOL IS 5 mg/dL FOR MEDICAL PURPOSES ONLY   Troponin I     Status: Abnormal   Collection Time: 10/14/15  7:42 PM  Result Value Ref Range   Troponin I 0.05 (H) <0.031 ng/mL    Comment: READ BACK AND VERIFIED WITH  Mali VANDYKE ON 10/14/15 AT 2047PM BY TB.        PERSISTENTLY INCREASED TROPONIN VALUES IN THE RANGE OF 0.04-0.49 ng/mL CAN BE SEEN IN:       -UNSTABLE ANGINA       -CONGESTIVE HEART FAILURE       -MYOCARDITIS       -CHEST TRAUMA       -ARRYHTHMIAS       -LATE PRESENTING MYOCARDIAL INFARCTION       -COPD   CLINICAL FOLLOW-UP RECOMMENDED.   Salicylate level     Status: None   Collection Time: 10/14/15  7:42 PM  Result Value Ref Range   Salicylate Lvl <9.3 2.8 - 30.0 mg/dL  Acetaminophen level     Status: Abnormal   Collection Time: 10/14/15  7:42 PM  Result Value Ref Range   Acetaminophen (Tylenol), Serum <10 (L) 10 - 30 ug/mL    Comment:        THERAPEUTIC CONCENTRATIONS VARY SIGNIFICANTLY. A RANGE OF 10-30 ug/mL MAY BE AN EFFECTIVE CONCENTRATION FOR MANY PATIENTS. HOWEVER, SOME ARE BEST TREATED AT CONCENTRATIONS OUTSIDE THIS RANGE. ACETAMINOPHEN CONCENTRATIONS >150 ug/mL AT 4 HOURS AFTER INGESTION AND >50 ug/mL AT 12 HOURS AFTER INGESTION ARE OFTEN ASSOCIATED WITH TOXIC REACTIONS.   CBC with Differential     Status: Abnormal   Collection Time: 10/14/15  7:42 PM  Result Value Ref Range   WBC 10.7 3.6 - 11.0 K/uL   RBC 4.19 3.80 - 5.20 MIL/uL   Hemoglobin 12.8 12.0 - 16.0 g/dL   HCT 38.8 35.0 - 47.0 %   MCV 92.4 80.0 - 100.0 fL   MCH 30.6 26.0 - 34.0 pg   MCHC 33.1 32.0 - 36.0 g/dL   RDW 13.4 11.5 - 14.5 %   Platelets 229 150 - 440 K/uL   Neutrophils Relative % 78 %   Neutro Abs 8.4 (H) 1.4 - 6.5 K/uL   Lymphocytes Relative 10 %   Lymphs Abs 1.1 1.0 - 3.6 K/uL   Monocytes Relative 10 %   Monocytes Absolute 1.0 (H) 0.2 - 0.9 K/uL   Eosinophils Relative 1 %   Eosinophils Absolute 0.1 0 - 0.7 K/uL   Basophils Relative 1 %   Basophils Absolute 0.1 0 - 0.1 K/uL  Troponin I (q 6hr x 3)     Status: Abnormal   Collection Time: 10/15/15 12:12 AM  Result Value Ref Range   Troponin I 0.04 (H) <0.031 ng/mL    Comment: PREVIOUS RESULT CALLED BY TB AT 2047 10/14/15  WDM        PERSISTENTLY INCREASED TROPONIN VALUES IN THE RANGE OF 0.04-0.49 ng/mL CAN BE SEEN IN:       -UNSTABLE ANGINA       -CONGESTIVE HEART FAILURE       -MYOCARDITIS       -CHEST TRAUMA       -ARRYHTHMIAS       -LATE PRESENTING MYOCARDIAL INFARCTION       -COPD   CLINICAL FOLLOW-UP RECOMMENDED.   TSH     Status: None   Collection Time: 10/15/15 12:12 AM  Result Value Ref Range   TSH 4.441 0.350 - 4.500 uIU/mL  Basic metabolic panel     Status: Abnormal   Collection Time: 10/15/15  5:37 AM  Result Value Ref Range   Sodium 140 135 - 145 mmol/L   Potassium 3.4 (L) 3.5 - 5.1 mmol/L   Chloride 109 101 - 111 mmol/L   CO2 26 22 - 32 mmol/L   Glucose, Bld 87 65 - 99 mg/dL   BUN 25 (H) 6 - 20 mg/dL   Creatinine, Ser 0.95 0.44 - 1.00 mg/dL   Calcium 10.0 8.9 - 10.3 mg/dL   GFR calc non Af Amer 52 (L) >60 mL/min   GFR calc Af Amer 60 (L) >60 mL/min    Comment: (NOTE) The eGFR has been calculated using the CKD EPI equation. This calculation has not been validated in all clinical situations. eGFR's persistently <60 mL/min signify possible Chronic Kidney Disease.    Anion gap 5 5 - 15  CBC     Status: Abnormal   Collection Time: 10/15/15  5:37 AM  Result Value Ref Range   WBC 6.6 3.6 - 11.0 K/uL   RBC 3.48 (L) 3.80 - 5.20 MIL/uL   Hemoglobin 10.8 (L) 12.0 - 16.0 g/dL   HCT 31.9 (L) 35.0 - 47.0 %   MCV 91.8 80.0 - 100.0 fL   MCH 31.1 26.0 - 34.0 pg   MCHC 33.9 32.0 - 36.0 g/dL   RDW 13.1 11.5 - 14.5 %   Platelets 190 150 - 440 K/uL  Troponin I (q 6hr x 3)     Status: Abnormal   Collection Time: 10/15/15  5:37 AM  Result Value Ref Range   Troponin I 0.04 (H) <0.031 ng/mL    Comment: PREVIOUS RESULT CALLED TO Mali VANDYKE ON 10/14/15 AT 2047 BY TB/SRC        PERSISTENTLY INCREASED TROPONIN VALUES IN THE RANGE OF 0.04-0.49 ng/mL CAN BE SEEN IN:       -UNSTABLE ANGINA       -CONGESTIVE HEART FAILURE       -MYOCARDITIS       -CHEST TRAUMA       -ARRYHTHMIAS        -LATE PRESENTING MYOCARDIAL INFARCTION       -COPD   CLINICAL FOLLOW-UP RECOMMENDED.   Troponin I (q 6hr x 3)     Status: Abnormal   Collection Time: 10/15/15 12:06 PM  Result Value Ref Range   Troponin I 0.04 (H) <0.031 ng/mL    Comment: PREVIOUS RESULT CALLED TO Mali VANDYKE ON 10/14/15 AT 2047 BY TB/SRC        PERSISTENTLY INCREASED TROPONIN VALUES IN THE RANGE OF 0.04-0.49 ng/mL CAN BE SEEN IN:       -UNSTABLE ANGINA       -  CONGESTIVE HEART FAILURE       -MYOCARDITIS       -CHEST TRAUMA       -ARRYHTHMIAS       -LATE PRESENTING MYOCARDIAL INFARCTION       -COPD   CLINICAL FOLLOW-UP RECOMMENDED.   CBC     Status: Abnormal   Collection Time: 10/16/15  5:32 AM  Result Value Ref Range   WBC 4.3 3.6 - 11.0 K/uL   RBC 2.78 (L) 3.80 - 5.20 MIL/uL   Hemoglobin 8.4 (L) 12.0 - 16.0 g/dL   HCT 26.6 (L) 35.0 - 47.0 %   MCV 95.7 80.0 - 100.0 fL   MCH 30.4 26.0 - 34.0 pg   MCHC 31.7 (L) 32.0 - 36.0 g/dL   RDW 13.9 11.5 - 14.5 %   Platelets 144 (L) 150 - 440 K/uL  Basic metabolic panel     Status: Abnormal   Collection Time: 10/16/15  5:32 AM  Result Value Ref Range   Sodium 140 135 - 145 mmol/L   Potassium 3.0 (L) 3.5 - 5.1 mmol/L   Chloride 114 (H) 101 - 111 mmol/L   CO2 22 22 - 32 mmol/L   Glucose, Bld 78 65 - 99 mg/dL   BUN 17 6 - 20 mg/dL   Creatinine, Ser 0.94 0.44 - 1.00 mg/dL   Calcium 9.0 8.9 - 10.3 mg/dL   GFR calc non Af Amer 52 (L) >60 mL/min   GFR calc Af Amer >60 >60 mL/min    Comment: (NOTE) The eGFR has been calculated using the CKD EPI equation. This calculation has not been validated in all clinical situations. eGFR's persistently <60 mL/min signify possible Chronic Kidney Disease.    Anion gap 4 (L) 5 - 15  Blood gas, arterial     Status: Abnormal   Collection Time: 10/16/15  5:00 PM  Result Value Ref Range   FIO2 21.00    pH, Arterial 7.46 (H) 7.350 - 7.450   pCO2 arterial 31 (L) 32.0 - 48.0 mmHg   pO2, Arterial 71 (L) 83.0 - 108.0 mmHg    Bicarbonate 22.0 21.0 - 28.0 mEq/L   Acid-base deficit 1.3 0.0 - 2.0 mmol/L   O2 Saturation 95.0 %   Patient temperature 37.0    Collection site RIGHT RADIAL    Sample type ARTERIAL DRAW    Allens test (pass/fail) POSITIVE (A) PASS  Urinalysis complete, with microscopic ([facility] only)     Status: Abnormal   Collection Time: 10/16/15  5:15 PM  Result Value Ref Range   Color, Urine STRAW (A) YELLOW   APPearance CLEAR (A) CLEAR   Glucose, UA NEGATIVE NEGATIVE mg/dL   Bilirubin Urine NEGATIVE NEGATIVE   Ketones, ur NEGATIVE NEGATIVE mg/dL   Specific Gravity, Urine 1.008 1.005 - 1.030   Hgb urine dipstick NEGATIVE NEGATIVE   pH 5.0 5.0 - 8.0   Protein, ur NEGATIVE NEGATIVE mg/dL   Nitrite NEGATIVE NEGATIVE   Leukocytes, UA NEGATIVE NEGATIVE   RBC / HPF 0-5 0 - 5 RBC/hpf   WBC, UA 0-5 0 - 5 WBC/hpf   Bacteria, UA NONE SEEN NONE SEEN   Squamous Epithelial / LPF NONE SEEN NONE SEEN   Mucous PRESENT   Ammonia     Status: None   Collection Time: 10/16/15  5:53 PM  Result Value Ref Range   Ammonia 10 9 - 35 umol/L    Current Facility-Administered Medications  Medication Dose Route Frequency Provider Last Rate Last Dose  . 0.9 %  sodium chloride infusion   Intravenous Continuous Lytle Butte, MD 75 mL/hr at 10/16/15 6270    . acetaminophen (TYLENOL) tablet 650 mg  650 mg Oral Q6H PRN Lytle Butte, MD       Or  . acetaminophen (TYLENOL) suppository 650 mg  650 mg Rectal Q6H PRN Lytle Butte, MD      . aspirin EC tablet 81 mg  81 mg Oral Daily Lytle Butte, MD   81 mg at 10/15/15 1040  . cefTRIAXone (ROCEPHIN) 1 g in dextrose 5 % 50 mL IVPB  1 g Intravenous Q24H Lytle Butte, MD   1 g at 10/15/15 2314  . fluticasone (FLONASE) 50 MCG/ACT nasal spray 1-2 spray  1-2 spray Each Nare Daily Lytle Butte, MD   Stopped at 10/16/15 1000  . haloperidol lactate (HALDOL) injection 0.5 mg  0.5 mg Intravenous Q6H PRN Gonzella Lex, MD      . heparin injection 5,000 Units  5,000 Units Subcutaneous 3  times per day Lytle Butte, MD   5,000 Units at 10/16/15 [phone]  . montelukast (SINGULAIR) tablet 10 mg  10 mg Oral QHS Lytle Butte, MD   10 mg at 10/15/15 2319  . ondansetron (ZOFRAN) tablet 4 mg  4 mg Oral Q6H PRN Lytle Butte, MD       Or  . ondansetron [facility]) injection 4 mg  4 mg Intravenous Q6H PRN Lytle Butte, MD      . vitamin C (ASCORBIC ACID) tablet 500 mg  500 mg Oral Daily Lytle Butte, MD   500 mg at 10/15/15 1040    Musculoskeletal: Strength & Muscle Tone: flaccid Gait & Station: unable to stand Patient leans: N/A  Psychiatric Specialty Exam: Review of Systems  Unable to perform ROS: medical condition    Blood pressure 127/80, pulse 63, temperature 98.8 F (37.1 C), temperature source Oral, resp. rate 20, height 5' 2"  (1.575 m), weight 52.617 kg (116 lb), SpO2 98 %.Body mass index is 21.21 kg/(m^2).  General Appearance: Disheveled  Eye Contact::  None  Speech:  Garbled and Slurred  Volume:  Decreased  Mood:  Unable to state  Affect:  Constricted  Thought Process:  Patient is moderating but doesn't make any sense and doesn't respond to any questions  Orientation:  Negative  Thought Content:  Negative  Suicidal Thoughts:  No  Homicidal Thoughts:  No  Memory:  Negative  Judgement:  Negative  Insight:  Negative  Psychomotor Activity:  Negative  Concentration:  Negative  Recall:  Negative  Fund of Knowledge:Negative  Language: Negative  Akathisia:  Negative  Handed:  Right  AIMS (if indicated):     Assets:  Social Support  ADL's:  Impaired  Cognition: Impaired,  Severe  Sleep:      Treatment Plan Summary: Daily contact with patient to assess and evaluate symptoms and progress in treatment, Medication management and Plan Patient is looking worse than she was yesterday. I suspect that perhaps some of the medicines that were being added in an attempt to help to calm her down and make her feel better may have been a little too strong for an 81 year old woman.  30 mg is a pretty large amount of mirtazapine to start out with and somebody this elderly and the high doses of intravenous Haldol and any of the Ativan may have been enough to keep her sedated throughout the entire day today.I am going to discontinue the mirtazapine as  I still don't think there is any evidence that this is a major depression to be treated. she is delirious currently. I am going to discontinue the when necessary Ativan. Low dose half milligram of Haldol every 6 hours as needed for agitation. I would not venture to add anything else right now although I am afraid she may have a restless night since she's been asleep all day. Try and get her back to eating and drinking a little bit. I will follow up tomorrow.   Disposition: Supportive therapy provided about ongoing stressors.  Ellen Evans 10/16/2015 7:27 PM

## 2015-10-16 NOTE — Progress Notes (Signed)
Family requesting not to give 1330 and 1400 meds. They would like for pt to continue to sleep, since she has not rested

## 2015-10-16 NOTE — Progress Notes (Signed)
Pt resting resp even non labored. Daughter at bedside

## 2015-10-16 NOTE — Progress Notes (Signed)
Confused and agitated during the night. Notified Dr. Clint GuyHower and haldol given once. Grandson at side. IV fluids infusing. IV antibiotics given. Continue to monitor.

## 2015-10-16 NOTE — Progress Notes (Signed)
Notified Dr. Sheryle Hailiamond of BP of 89/67 and that no IV in place. Will continue to monitor.

## 2015-10-16 NOTE — Progress Notes (Signed)
[place] at [place] NAME: Ellen Evans    MR#:  [id number]  DATE OF BIRTH:  Mar 25, 1924  SUBJECTIVE:  CHIEF COMPLAINT:   Chief Complaint  Patient presents with  . Altered Mental Status    per family more confused today and not wanting to walk anywhere. started this am. no other symptoms   Very lethargic again today, not following commands, pushes me away when trying to examine.  REVIEW OF SYSTEMS:   ROS unable to obtain  DRUG ALLERGIES:  No Known Allergies  VITALS:  Blood pressure 127/80, pulse 63, temperature 98.8 F (37.1 C), temperature source Oral, resp. rate 20, height 5' 2"  (1.575 m), weight 52.617 kg (116 lb), SpO2 98 %.  PHYSICAL EXAMINATION:  GENERAL:  81 y.o.-year-old patient lying on her right side, no distress EYES: Holds eyes closed  HEENT: Head atraumatic, normocephalic. Oropharynx and nasopharynx clear.  NECK:  Supple, no jugular venous distention. No thyroid enlargement, no tenderness.  LUNGS: Normal breath sounds bilaterally, no wheezing, rales,rhonchi or crepitation. No use of accessory muscles of respiration.  CARDIOVASCULAR: S1, S2 normal. No murmurs, rubs, or gallops.  ABDOMEN: Soft, nontender, nondistended. Bowel sounds present. No organomegaly or mass.  EXTREMITIES: No pedal edema, cyanosis, or clubbing.  NEUROLOGIC: Refuses to participate in examination, moves all 4 extremities spontaneously  PSYCHIATRIC: Refuses to participate in examination, SKIN: No obvious rash, lesion, or ulcer.    LABORATORY PANEL:   CBC  Recent Labs Lab 10/16/15 0532  WBC 4.3  HGB 8.4*  HCT 26.6*  PLT 144*   ------------------------------------------------------------------------------------------------------------------  Chemistries   Recent Labs Lab 10/14/15 1942  10/16/15 0532  NA 138  < > 140  K 4.2  < > 3.0*  CL 103  < > 114*  CO2 26  < > 22  GLUCOSE 99  < > 78  BUN 30*  < > 17  CREATININE 1.13*  <  > 0.94  CALCIUM 11.5*  < > 9.0  AST 25  --   --   ALT 14  --   --   ALKPHOS 56  --   --   BILITOT 0.7  --   --   < > = values in this interval not displayed. ------------------------------------------------------------------------------------------------------------------  Cardiac Enzymes  Recent Labs Lab 10/15/15 1206  TROPONINI 0.04*   ------------------------------------------------------------------------------------------------------------------  RADIOLOGY:  Dg Chest 2 View  10/14/2015  CLINICAL DATA:  Acute onset of lethargy and decreased appetite. Initial encounter. EXAM: CHEST  2 VIEW COMPARISON:  Chest radiograph performed 09/12/2009 FINDINGS: The lungs are well-aerated. Minimal bilateral atelectasis is noted. There is no evidence of pleural effusion or pneumothorax. The heart is borderline normal in size. No acute osseous abnormalities are seen. IMPRESSION: Minimal bilateral atelectasis noted.  Lungs otherwise clear. Electronically Signed   By: Garald Balding M.D.   On: 10/14/2015 18:22   Ct Head Wo Contrast  10/14/2015  CLINICAL DATA:  Worsening confusion and poor mobility, acute onset. Initial encounter. EXAM: CT HEAD WITHOUT CONTRAST TECHNIQUE: Contiguous axial images were obtained from the base of the skull through the vertex without intravenous contrast. COMPARISON:  None. FINDINGS: There is no evidence of acute infarction, mass lesion, or intra- or extra-axial hemorrhage on CT. Prominence of the ventricles and sulci reflects moderate cortical volume loss. Mild cerebellar atrophy is noted. Scattered periventricular and subcortical white matter change likely reflects small vessel ischemic microangiopathy. The brainstem and fourth ventricle are within normal limits. The basal ganglia are  unremarkable in appearance. The cerebral hemispheres demonstrate grossly normal gray-white differentiation. No mass effect or midline shift is seen. There is no evidence of fracture; visualized  osseous structures are unremarkable in appearance. The visualized portions of the orbits are within normal limits. The paranasal sinuses and mastoid air cells are well-aerated. No significant soft tissue abnormalities are seen. IMPRESSION: 1. No acute intracranial pathology seen on CT. 2. Moderate cortical volume loss and scattered small vessel ischemic microangiopathy. Electronically Signed   By: Garald Balding M.D.   On: 10/14/2015 19:01    EKG:   Orders placed or performed during the hospital encounter of 10/14/15  . ED EKG  . ED EKG  . EKG 12-Lead  . EKG 12-Lead    ASSESSMENT AND PLAN:   1. Altered mental status: Looking back through the notes this has been present on and off since at least October. She has been following with a neurologist.  - Likely underlying dementia - CT head shows diffuse small vessel disease and atrophy - TSH normal, check ESR, B12, ammonia, ABG - Has a history of depression. Seen by psychiatry during this admission. Mirtazapine started by me on admission. Otherwise psychiatry does not recommend any other medication changes - Not progressing, will ask for neurology consultation - Discontinue statin - She presented to primary care in October with complaint of increasing somnolence, confusion, hallucinations for one month. She was referred to neurology but has not yet had an appointment. Family reports that she improved after this appointment with physical therapy at home  2. Urinary tract infection: Mixed culture. Re-collect UA and culture. Continue Rocephin for now  3. Hypertension: Hold antihypertensives as blood pressure is low.  All the records are reviewed and case discussed with Care Management/Social Workerr. Management plans discussed with the patient, family and they are in agreement.  CODE STATUS: Full   TOTAL TIME TAKING CARE OF THIS PATIENT: 40 minutes.  Greater than 50% of time spent in care coordination and counseling. Spoke with patient's  daughter at the bedside. POSSIBLE D/C IN 2 DAYS, DEPENDING ON CLINICAL CONDITION.  Myrtis Ser M.D on 10/16/2015 at 4:53 PM  Between 7am to 6pm - Pager - [phone]  After 6pm go to [email or web address] - password [email or web address] [facility]  Office  [phone]  CC: Primary care physician; Ezequiel Kayser, MD

## 2015-10-16 NOTE — Progress Notes (Signed)
I&O cath done to collect urine as order by MD. Urine sent to lab

## 2015-10-16 NOTE — Progress Notes (Signed)
PT Cancellation Note  Patient Details Name: Ellen Evans MRN: [id number] DOB: 1924/03/05   Cancelled Treatment:    Reason Eval/Treat Not Completed: Fatigue/lethargy limiting ability to participate;Other (comment) (Asleep, meds given to calm pt).  Will retry as time allows.   Ivar DrapeStout, Laurrie Toppin E 10/16/2015, 11:21 AM   Samul Dadauth Yamili Lichtenwalner, PT MS Acute Rehab Dept. Number: [facility] [id number] and [facility] [phone]

## 2015-10-16 NOTE — Care Management (Signed)
patient is followed by [facility] home health nursing and physical therapy.  If patient is able to discharge home, would benefit from adding social work to assist with community resources for dementia.  Discussed the dementia specialist with [facility].  Will discuss with nurse liaison to determine if [facility] could be an option at discharge.

## 2015-10-17 DIAGNOSIS — R410 Disorientation, unspecified: Secondary | ICD-10-CM | POA: Diagnosis not present

## 2015-10-17 LAB — HEPATIC FUNCTION PANEL
ALBUMIN: 3.2 g/dL — AB (ref 3.5–5.0)
ALT: 17 U/L (ref 14–54)
AST: 31 U/L (ref 15–41)
Alkaline Phosphatase: 46 U/L (ref 38–126)
Total Bilirubin: 0.7 mg/dL (ref 0.3–1.2)
Total Protein: 5.9 g/dL — ABNORMAL LOW (ref 6.5–8.1)

## 2015-10-17 LAB — CBC
HCT: 32.8 % — ABNORMAL LOW (ref 35.0–47.0)
HEMOGLOBIN: 10.6 g/dL — AB (ref 12.0–16.0)
MCH: 30 pg (ref 26.0–34.0)
MCHC: 32.3 g/dL (ref 32.0–36.0)
MCV: 92.9 fL (ref 80.0–100.0)
Platelets: 174 10*3/uL (ref 150–440)
RBC: 3.53 MIL/uL — AB (ref 3.80–5.20)
RDW: 13.4 % (ref 11.5–14.5)
WBC: 7.4 10*3/uL (ref 3.6–11.0)

## 2015-10-17 LAB — BASIC METABOLIC PANEL
ANION GAP: 7 (ref 5–15)
BUN: 17 mg/dL (ref 6–20)
CHLORIDE: 118 mmol/L — AB (ref 101–111)
CO2: 22 mmol/L (ref 22–32)
Calcium: 10 mg/dL (ref 8.9–10.3)
Creatinine, Ser: 0.99 mg/dL (ref 0.44–1.00)
GFR calc Af Amer: 57 mL/min — ABNORMAL LOW (ref >60)
GFR calc non Af Amer: 49 mL/min — ABNORMAL LOW (ref >60)
GLUCOSE: 81 mg/dL (ref 65–99)
POTASSIUM: 3.8 mmol/L (ref 3.5–5.1)
SODIUM: 147 mmol/L — AB (ref 135–145)

## 2015-10-17 LAB — CK: CK TOTAL: 248 U/L — AB (ref 38–234)

## 2015-10-17 LAB — VITAMIN B12: Vitamin B-12: 495 pg/mL (ref 180–914)

## 2015-10-17 LAB — SEDIMENTATION RATE: Sed Rate: 39 mm/hr — ABNORMAL HIGH (ref 0–30)

## 2015-10-17 MED ORDER — CLONAZEPAM 0.5 MG PO TABS
0.2500 mg | ORAL_TABLET | Freq: Every day | ORAL | Status: DC
  Administered 2015-10-18 – 2015-10-20 (×2): 0.25 mg via ORAL
  Filled 2015-10-17 (×4): qty 1

## 2015-10-17 MED ORDER — AMANTADINE HCL 50 MG/5ML PO SYRP
100.0000 mg | ORAL_SOLUTION | Freq: Two times a day (BID) | ORAL | Status: DC
  Administered 2015-10-17 – 2015-10-20 (×4): 100 mg via ORAL
  Filled 2015-10-17 (×16): qty 10

## 2015-10-17 MED ORDER — HALOPERIDOL LACTATE 5 MG/ML IJ SOLN
0.5000 mg | INTRAMUSCULAR | Status: AC
  Administered 2015-10-17: 0.5 mg via INTRAVENOUS
  Filled 2015-10-17: qty 1

## 2015-10-17 NOTE — Progress Notes (Signed)
Pts has no output this shift. Bladder scan showed 355 ml in bladder. MD instructed to recheck in 2 hours and if >400 ml to insert a foley. Will continue to assess.

## 2015-10-17 NOTE — Progress Notes (Signed)
PT Cancellation Note  Patient Details Name: Ellen Evans MRN: [id number] DOB: 1924/07/04   Cancelled Treatment:     PT reviewed patient chart and discussed with RN who gave clearance to work with patient. Upon entering the room patient had removed one glove and was attempting to remove other glove. PT alerted RN. Daughter in room states "this is not my mother", referring to her cognitive status. Patient is not appropriate for PT evaluation at this time, will continue to attempt as appropriate.   Kerin RansomPatrick A McNamara, PT, DPT    10/17/2015, 9:18 AM

## 2015-10-17 NOTE — Consult Note (Signed)
Reason for Consult: confusion Referring Physician: Dr. Wellington Hampshire is an 81 y.o. female.  HPI:  81 yo RHD F presents to [facility] due to increased confusion.  Per family, she has had some neurologic decline over the past 3 months but has had some fluctuations.  She does not normally leave her own house where she ambulates on her own but needs help with most ADLs.  She walks with a walker and is kinda stooped over per family but no tremor is noted.    Past Medical History  Diagnosis Date  . Hypertension   . Vertigo     History reviewed. No pertinent past surgical history.  Family History  Problem Relation Age of Onset  . Diabetes Neg Hx     Social History:  reports that she has never smoked. She does not have any smokeless tobacco history on file. She reports that she does not drink alcohol. Her drug history is not on file.  Allergies: No Known Allergies  Medications: personally reviewed by me as per chart  Results for orders placed or performed during the hospital encounter of 10/14/15 (from the past 48 hour(s))  Troponin I (q 6hr x 3)     Status: Abnormal   Collection Time: 10/15/15 12:06 PM  Result Value Ref Range   Troponin I 0.04 (H) <0.031 ng/mL    Comment: PREVIOUS RESULT CALLED TO Mali VANDYKE ON 10/14/15 AT 2047 BY TB/SRC        PERSISTENTLY INCREASED TROPONIN VALUES IN THE RANGE OF 0.04-0.49 ng/mL CAN BE SEEN IN:       -UNSTABLE ANGINA       -CONGESTIVE HEART FAILURE       -MYOCARDITIS       -CHEST TRAUMA       -ARRYHTHMIAS       -LATE PRESENTING MYOCARDIAL INFARCTION       -COPD   CLINICAL FOLLOW-UP RECOMMENDED.   CBC     Status: Abnormal   Collection Time: 10/16/15  5:32 AM  Result Value Ref Range   WBC 4.3 3.6 - 11.0 K/uL   RBC 2.78 (L) 3.80 - 5.20 MIL/uL   Hemoglobin 8.4 (L) 12.0 - 16.0 g/dL   HCT 26.6 (L) 35.0 - 47.0 %   MCV 95.7 80.0 - 100.0 fL   MCH 30.4 26.0 - 34.0 pg   MCHC 31.7 (L) 32.0 - 36.0 g/dL   RDW 13.9 11.5 - 14.5 %   Platelets 144  (L) 150 - 440 K/uL  Basic metabolic panel     Status: Abnormal   Collection Time: 10/16/15  5:32 AM  Result Value Ref Range   Sodium 140 135 - 145 mmol/L   Potassium 3.0 (L) 3.5 - 5.1 mmol/L   Chloride 114 (H) 101 - 111 mmol/L   CO2 22 22 - 32 mmol/L   Glucose, Bld 78 65 - 99 mg/dL   BUN 17 6 - 20 mg/dL   Creatinine, Ser 0.94 0.44 - 1.00 mg/dL   Calcium 9.0 8.9 - 10.3 mg/dL   GFR calc non Af Amer 52 (L) >60 mL/min   GFR calc Af Amer >60 >60 mL/min    Comment: (NOTE) The eGFR has been calculated using the CKD EPI equation. This calculation has not been validated in all clinical situations. eGFR's persistently <60 mL/min signify possible Chronic Kidney Disease.    Anion gap 4 (L) 5 - 15  Blood gas, arterial     Status: Abnormal   Collection Time: 10/16/15  5:00 PM  Result Value Ref Range   FIO2 21.00    pH, Arterial 7.46 (H) 7.350 - 7.450   pCO2 arterial 31 (L) 32.0 - 48.0 mmHg   pO2, Arterial 71 (L) 83.0 - 108.0 mmHg   Bicarbonate 22.0 21.0 - 28.0 mEq/L   Acid-base deficit 1.3 0.0 - 2.0 mmol/L   O2 Saturation 95.0 %   Patient temperature 37.0    Collection site RIGHT RADIAL    Sample type ARTERIAL DRAW    Allens test (pass/fail) POSITIVE (A) PASS  Urinalysis complete, with microscopic ([facility] only)     Status: Abnormal   Collection Time: 10/16/15  5:15 PM  Result Value Ref Range   Color, Urine STRAW (A) YELLOW   APPearance CLEAR (A) CLEAR   Glucose, UA NEGATIVE NEGATIVE mg/dL   Bilirubin Urine NEGATIVE NEGATIVE   Ketones, ur NEGATIVE NEGATIVE mg/dL   Specific Gravity, Urine 1.008 1.005 - 1.030   Hgb urine dipstick NEGATIVE NEGATIVE   pH 5.0 5.0 - 8.0   Protein, ur NEGATIVE NEGATIVE mg/dL   Nitrite NEGATIVE NEGATIVE   Leukocytes, UA NEGATIVE NEGATIVE   RBC / HPF 0-5 0 - 5 RBC/hpf   WBC, UA 0-5 0 - 5 WBC/hpf   Bacteria, UA NONE SEEN NONE SEEN   Squamous Epithelial / LPF NONE SEEN NONE SEEN   Mucous PRESENT   Ammonia     Status: None   Collection Time: 10/16/15  5:53  PM  Result Value Ref Range   Ammonia 10 9 - 35 umol/L  Culture, blood (routine x 2)     Status: None (Preliminary result)   Collection Time: 10/16/15  5:53 PM  Result Value Ref Range   Specimen Description BLOOD RIGHT ASSIST CONTROL    Special Requests BAA,ANA,AER,5ML    Culture NO GROWTH < 24 HOURS    Report Status PENDING   Culture, blood (routine x 2)     Status: None (Preliminary result)   Collection Time: 10/16/15  5:53 PM  Result Value Ref Range   Specimen Description BLOOD RIGHT ARM    Special Requests BAA,5ML,ANA,AER    Culture NO GROWTH < 24 HOURS    Report Status PENDING   CBC     Status: Abnormal   Collection Time: 10/17/15  6:04 AM  Result Value Ref Range   WBC 7.4 3.6 - 11.0 K/uL   RBC 3.53 (L) 3.80 - 5.20 MIL/uL   Hemoglobin 10.6 (L) 12.0 - 16.0 g/dL    Comment: RESULT REPEATED AND VERIFIED   HCT 32.8 (L) 35.0 - 47.0 %   MCV 92.9 80.0 - 100.0 fL   MCH 30.0 26.0 - 34.0 pg   MCHC 32.3 32.0 - 36.0 g/dL   RDW 13.4 11.5 - 14.5 %   Platelets 174 150 - 440 K/uL  Basic metabolic panel     Status: Abnormal   Collection Time: 10/17/15  6:04 AM  Result Value Ref Range   Sodium 147 (H) 135 - 145 mmol/L   Potassium 3.8 3.5 - 5.1 mmol/L   Chloride 118 (H) 101 - 111 mmol/L   CO2 22 22 - 32 mmol/L   Glucose, Bld 81 65 - 99 mg/dL   BUN 17 6 - 20 mg/dL   Creatinine, Ser 0.99 0.44 - 1.00 mg/dL   Calcium 10.0 8.9 - 10.3 mg/dL   GFR calc non Af Amer 49 (L) >60 mL/min   GFR calc Af Amer 57 (L) >60 mL/min    Comment: (NOTE) The eGFR  has been calculated using the CKD EPI equation. This calculation has not been validated in all clinical situations. eGFR's persistently <60 mL/min signify possible Chronic Kidney Disease.    Anion gap 7 5 - 15    No results found.  Review of Systems  Unable to perform ROS: dementia   Blood pressure 136/100, pulse 99, temperature 98 F (36.7 C), temperature source Oral, resp. rate 16, height 5' 2"  (1.575 m), weight 52.617 kg (116 lb), SpO2  99 %. Physical Exam  Nursing note and vitals reviewed. Constitutional: She appears well-developed and well-nourished. She appears distressed.  HENT:  Head: Normocephalic and atraumatic.  Right Ear: External ear normal.  Left Ear: External ear normal.  Nose: Nose normal.  Mouth/Throat: Oropharynx is clear and moist.  Eyes: Conjunctivae and EOM are normal. Pupils are equal, round, and reactive to light. No scleral icterus.  Neck: Normal range of motion.  Cardiovascular: Normal rate, regular rhythm, normal heart sounds and intact distal pulses.   No murmur heard. Respiratory: Effort normal and breath sounds normal. No respiratory distress.  GI: Soft. Bowel sounds are normal. She exhibits no distension.  Musculoskeletal: Normal range of motion. She exhibits no edema.  Neurological:  Alert but not oriented, moderate dysarthria, rarely follows but does get agitated PERRLA, EOMI, face symmetric, tongue midline Moves R>L, increased tone with some bradykinesia noted untestable cerebellar  1+/4 B, withdraw plantars Grimaces to pain in all 4 ext  Skin: Skin is warm. She is not diaphoretic.   CT of head personally reviewed by me and shows mild white matter changes  Assessment/Plan: 1.  Probable dementia with some sundowning-  Fluctuations can be seen in Lewy body plus gait changes but it is unclear all of history;  All types of dementia are possible except multi-infarct. 2.  Mild dyskinesias-  This is likely due to neuroleptics -  Hold all Neuroleptics if possible -  Check CPK, LFTs -  Start amantadine 160m BID -  Start clonazepam 0.522mqHS -  Lights on during the day and up in chair TID -  Will follow briefly  Ellen Evans 10/17/2015, 11:50 AM

## 2015-10-17 NOTE — Progress Notes (Signed)
[place] [facility] [place] NAME: Ellen Evans    MR#:  [id number]  DATE OF BIRTH:  04-24-1924  SUBJECTIVE:  CHIEF COMPLAINT:   Chief Complaint  Patient presents with  . Altered Mental Status    per family more confused today and not wanting to walk anywhere. started this am. no other symptoms   Slightly more alert today, talking but not making sense  REVIEW OF SYSTEMS:   ROS unable to obtain  DRUG ALLERGIES:  No Known Allergies  VITALS:  Blood pressure 136/100, pulse 99, temperature 98 F (36.7 C), temperature source Oral, resp. rate 16, height 5' 2"  (1.575 m), weight 52.617 kg (116 lb), SpO2 99 %.  PHYSICAL EXAMINATION:  GENERAL:  81 y.o.-year-old patient lying on her right side, no distress EYES: PERRLA HEENT: Head atraumatic, normocephalic. Oropharynx and nasopharynx clear.  NECK:  Supple, no jugular venous distention. No thyroid enlargement, no tenderness.  LUNGS: Normal breath sounds bilaterally, no wheezing, rales,rhonchi or crepitation. No use of accessory muscles of respiration.  CARDIOVASCULAR: S1, S2 normal. No murmurs, rubs, or gallops.  ABDOMEN: Soft, nontender, nondistended. Bowel sounds present. No organomegaly or mass.  EXTREMITIES: No pedal edema, cyanosis, or clubbing.  NEUROLOGIC: Refuses to participate in examination, moves all 4 extremities spontaneously , speaks spontaneously but does not answer questions PSYCHIATRIC: Alert, disoriented, calm SKIN: No obvious rash, lesion, or ulcer.    LABORATORY PANEL:   CBC  Recent Labs Lab 10/17/15 0604  WBC 7.4  HGB 10.6*  HCT 32.8*  PLT 174   ------------------------------------------------------------------------------------------------------------------  Chemistries   Recent Labs Lab 10/17/15 0604  NA 147*  K 3.8  CL 118*  CO2 22  GLUCOSE 81  BUN 17  CREATININE 0.99  CALCIUM 10.0  AST 31  ALT 17  ALKPHOS 46  BILITOT 0.7    ------------------------------------------------------------------------------------------------------------------  Cardiac Enzymes  Recent Labs Lab 10/15/15 1206  TROPONINI 0.04*   ------------------------------------------------------------------------------------------------------------------  RADIOLOGY:  No results found.  EKG:   Orders placed or performed during the hospital encounter of 10/14/15  . ED EKG  . ED EKG  . EKG 12-Lead  . EKG 12-Lead    ASSESSMENT AND PLAN:   1. Altered mental status: Looking back through the notes this has been present on and off since at least October. She has been following with a neurologist.  - Likely underlying dementia exacerbated by new environment - CT head shows diffuse small vessel disease and atrophy - TSH normal, ammonia, ABG all normal, check B12 and ESR - Has a history of depression. Seen by psychiatry during this admission. Mirtazapine started by me on admission, discontinued by psychiatry.  - Discontinue statin - Appreciate neurology consultation. Continue with reorientation measures. Continue amantadine and clonidine  2. Urinary tract infection: Mixed culture. Re-collect UA and culture. Continue Rocephin for now  3. Hypertension: Hold antihypertensives as blood pressure is low.  All the records are reviewed and case discussed with Care Management/Social Workerr. Management plans discussed with the patient, family and they are in agreement.  CODE STATUS: Full   TOTAL TIME TAKING CARE OF THIS PATIENT: 40 minutes.  Greater than 50% of time spent in care coordination and counseling. Spoke with patient's daughter at the bedside. POSSIBLE D/C IN 2 DAYS, DEPENDING ON CLINICAL CONDITION.  Myrtis Ser M.D on 10/17/2015 at 5:28 PM  Between 7am to 6pm - Pager - [phone]  After 6pm go to [email or web address] - password [email or web address] [facility]  Office  [phone]  CC: Primary care physician; Ezequiel Kayser, MD

## 2015-10-17 NOTE — Consult Note (Signed)
  Psychiatry: Patient seen, chart reviewed, also spoke with daughter who is at bedside. Paient this am still very delirious and unable to interact lucidly. Picking at the air. Muttering.  Got haldol last night but not since.   I am ordering 0.5mg  IV haldol stat now to try to calm down the delirium enough to allow her to eat and take fluid, which currently she is not doing. If the half milligram is too sedating now we will switch to 0.25mg  for daytime use going forward. Goal in the day is to calm the delirium to allow lucid behavior but not to put her to sleep.  Explained plan to daughter. Will follow. No change to dx of delirium.

## 2015-10-18 DIAGNOSIS — R410 Disorientation, unspecified: Secondary | ICD-10-CM | POA: Diagnosis not present

## 2015-10-18 LAB — CBC
HEMATOCRIT: 32.3 % — AB (ref 35.0–47.0)
HEMOGLOBIN: 10.9 g/dL — AB (ref 12.0–16.0)
MCH: 31.7 pg (ref 26.0–34.0)
MCHC: 33.9 g/dL (ref 32.0–36.0)
MCV: 93.5 fL (ref 80.0–100.0)
Platelets: 142 10*3/uL — ABNORMAL LOW (ref 150–440)
RBC: 3.45 MIL/uL — ABNORMAL LOW (ref 3.80–5.20)
RDW: 14.1 % (ref 11.5–14.5)
WBC: 6.4 10*3/uL (ref 3.6–11.0)

## 2015-10-18 LAB — BASIC METABOLIC PANEL
ANION GAP: 9 (ref 5–15)
BUN: 16 mg/dL (ref 6–20)
CALCIUM: 9.9 mg/dL (ref 8.9–10.3)
CHLORIDE: 118 mmol/L — AB (ref 101–111)
CO2: 21 mmol/L — AB (ref 22–32)
CREATININE: 0.95 mg/dL (ref 0.44–1.00)
GFR calc non Af Amer: 52 mL/min — ABNORMAL LOW (ref >60)
GFR, EST AFRICAN AMERICAN: 60 mL/min — AB (ref >60)
Glucose, Bld: 94 mg/dL (ref 65–99)
Potassium: 3.8 mmol/L (ref 3.5–5.1)
SODIUM: 148 mmol/L — AB (ref 135–145)

## 2015-10-18 LAB — URINE CULTURE
CULTURE: NO GROWTH
Special Requests: NORMAL

## 2015-10-18 LAB — VITAMIN B12: VITAMIN B 12: 587 pg/mL (ref 180–914)

## 2015-10-18 MED ORDER — DEXTROSE 5 % IV SOLN
INTRAVENOUS | Status: DC
  Administered 2015-10-18 – 2015-10-20 (×4): via INTRAVENOUS

## 2015-10-18 NOTE — Progress Notes (Signed)
Notified Dr. Cherlynn KaiserSainani that patient is having pauses and bradycardia (30's and 40's). Patient is asymptomatic. Sodium trending up, telephone order via Dr. Cherlynn KaiserSainani to change fluids to d5W 75 ml/hr. Trudee KusterBrandi R [place]

## 2015-10-18 NOTE — Progress Notes (Signed)
NEUROLOGY NOTE  S: Pt is much more awake today and eating.  Abnormal movements have resolved  ROS unobtainable due to dementia  O: 97.9    118/88    53   18 Nl weight, no distress Normocephalic, oropharynx clear Supple, no LAD CTA B, no wheezing RRR, no murmur No C/C/E  Alert and rarely follows, minimal verbal PERRLA, EOMI,  face symmetric 5-/5 B, no abnormal movements today  MRI with small stroke on L parietal lobe  A/P: 1. Probable dementia with some sundowning- improved with re-orientation; Fluctuations can be seen in Lewy body plus gait changes but it is unclear all of history; All types of dementia are possible except multi-infarct. 2. Mild dyskinesias- resolved,  This is likely due to neuroleptics - Hold all Neuroleptics if possible - continue amantadine 100mg  BID - continue clonazepam 0.5mg  qHS - Lights on during the day and up in chair TID - Will follow briefly

## 2015-10-18 NOTE — Consult Note (Signed)
[place] [facility]iat[facility] Consult   Reason for Consult:  Follow-up for this 81 year old woman with delirium Referring Physician:  Verdell Carmine Patient Identification: Ellen Evans MRN:  [id number] Principal Diagnosis: <Delirium Diagnosis:   Patient Active Problem List   Diagnosis Date Noted  . Encephalopathy acute [G93.40] 10/15/2015  . Acute delirium [R41.0] 10/15/2015  . Encephalopathy [G93.40] 10/14/2015  . UTI (lower urinary tract infection) [N39.0] 10/14/2015  . Atrial fibrillation ([place]) [I48.91] 10/14/2015    Total Time spent with patient: 25 minutes  Subjective:   Ellen Evans is a 81 y.o. female patient admitted with patient was not able to give any real information on her own.  HPI:  Updated history today. Information from the patient to some degree and from her granddaughter who is at bedside. Patient is able to say her name. She recognizes her granddaughter but gets her name wrong. Patient is able to tell me that she knows she is in a hospital. She answers a few other questions with yes and no nods. Is able to interact to some degree. Still pretty sedated but I'm told that she ate some food with assistance today.  Past Psychiatric History: No significant past psychiatric history other than dementia  Risk to Self: Is patient at risk for suicide?: No Risk to Others:   Prior Inpatient Therapy:   Prior Outpatient Therapy:    Past Medical History:  Past Medical History  Diagnosis Date  . Hypertension   . Vertigo    History reviewed. No pertinent past surgical history. Family History:  Family History  Problem Relation Age of Onset  . Diabetes Neg Hx    Family Psychiatric  History: No family psychiatric history Social History:  History  Alcohol Use No     History  Drug Use Not on file    Social History   Social History  . Marital Status: Widowed    Spouse Name: N/A  . Number of Children: N/A  . Years of Education: N/A   Social History Main Topics  . Smoking  status: Never Smoker   . Smokeless tobacco: None  . Alcohol Use: No  . Drug Use: None  . Sexual Activity: Not Asked   Other Topics Concern  . None   Social History Narrative  . None   Additional Social History:                          Allergies:  No Known Allergies  Labs:  Results for orders placed or performed during the hospital encounter of 10/14/15 (from the past 48 hour(s))  CBC     Status: Abnormal   Collection Time: 10/17/15  6:04 AM  Result Value Ref Range   WBC 7.4 3.6 - 11.0 K/uL   RBC 3.53 (L) 3.80 - 5.20 MIL/uL   Hemoglobin 10.6 (L) 12.0 - 16.0 g/dL    Comment: RESULT REPEATED AND VERIFIED   HCT 32.8 (L) 35.0 - 47.0 %   MCV 92.9 80.0 - 100.0 fL   MCH 30.0 26.0 - 34.0 pg   MCHC 32.3 32.0 - 36.0 g/dL   RDW 13.4 11.5 - 14.5 %   Platelets 174 150 - 440 K/uL  Basic metabolic panel     Status: Abnormal   Collection Time: 10/17/15  6:04 AM  Result Value Ref Range   Sodium 147 (H) 135 - 145 mmol/L   Potassium 3.8 3.5 - 5.1 mmol/L   Chloride 118 (H) 101 - 111 mmol/L  CO2 22 22 - 32 mmol/L   Glucose, Bld 81 65 - 99 mg/dL   BUN 17 6 - 20 mg/dL   Creatinine, Ser 0.99 0.44 - 1.00 mg/dL   Calcium 10.0 8.9 - 10.3 mg/dL   GFR calc non Af Amer 49 (L) >60 mL/min   GFR calc Af Amer 57 (L) >60 mL/min    Comment: (NOTE) The eGFR has been calculated using the CKD EPI equation. This calculation has not been validated in all clinical situations. eGFR's persistently <60 mL/min signify possible Chronic Kidney Disease.    Anion gap 7 5 - 15  CK     Status: Abnormal   Collection Time: 10/17/15  6:04 AM  Result Value Ref Range   Total CK 248 (H) 38 - 234 U/L  Hepatic function panel     Status: Abnormal   Collection Time: 10/17/15  6:04 AM  Result Value Ref Range   Total Protein 5.9 (L) 6.5 - 8.1 g/dL   Albumin 3.2 (L) 3.5 - 5.0 g/dL   AST 31 15 - 41 U/L   ALT 17 14 - 54 U/L   Alkaline Phosphatase 46 38 - 126 U/L   Total Bilirubin 0.7 0.3 - 1.2 mg/dL    Bilirubin, Direct <0.1 (L) 0.1 - 0.5 mg/dL   Indirect Bilirubin NOT CALCULATED 0.3 - 0.9 mg/dL  Vitamin B12     Status: None   Collection Time: 10/17/15  6:07 PM  Result Value Ref Range   Vitamin B-12 587 180 - 914 pg/mL    Comment: (NOTE) This assay is not validated for testing neonatal or myeloproliferative syndrome specimens for Vitamin B12 levels. Performed at [facility]   Sedimentation rate     Status: Abnormal   Collection Time: 10/17/15  6:07 PM  Result Value Ref Range   Sed Rate 39 (H) 0 - 30 mm/hr  CBC     Status: Abnormal   Collection Time: 10/18/15  4:42 AM  Result Value Ref Range   WBC 6.4 3.6 - 11.0 K/uL   RBC 3.45 (L) 3.80 - 5.20 MIL/uL   Hemoglobin 10.9 (L) 12.0 - 16.0 g/dL   HCT 32.3 (L) 35.0 - 47.0 %   MCV 93.5 80.0 - 100.0 fL   MCH 31.7 26.0 - 34.0 pg   MCHC 33.9 32.0 - 36.0 g/dL   RDW 14.1 11.5 - 14.5 %   Platelets 142 (L) 150 - 440 K/uL  Basic metabolic panel     Status: Abnormal   Collection Time: 10/18/15  4:42 AM  Result Value Ref Range   Sodium 148 (H) 135 - 145 mmol/L   Potassium 3.8 3.5 - 5.1 mmol/L   Chloride 118 (H) 101 - 111 mmol/L   CO2 21 (L) 22 - 32 mmol/L   Glucose, Bld 94 65 - 99 mg/dL   BUN 16 6 - 20 mg/dL   Creatinine, Ser 0.95 0.44 - 1.00 mg/dL   Calcium 9.9 8.9 - 10.3 mg/dL   GFR calc non Af Amer 52 (L) >60 mL/min   GFR calc Af Amer 60 (L) >60 mL/min    Comment: (NOTE) The eGFR has been calculated using the CKD EPI equation. This calculation has not been validated in all clinical situations. eGFR's persistently <60 mL/min signify possible Chronic Kidney Disease.    Anion gap 9 5 - 15    Current Facility-Administered Medications  Medication Dose Route Frequency Provider Last Rate Last Dose  . acetaminophen (TYLENOL) tablet 650 mg  650  mg Oral Q6H PRN Lytle Butte, MD       Or  . acetaminophen (TYLENOL) suppository 650 mg  650 mg Rectal Q6H PRN Lytle Butte, MD      . amantadine (SYMMETREL) solution 100 mg  100 mg  Oral BID Valora Corporal, MD   100 mg at 10/18/15 1344  . aspirin EC tablet 81 mg  81 mg Oral Daily Lytle Butte, MD   81 mg at 10/15/15 1040  . clonazePAM (KLONOPIN) tablet 0.25 mg  0.25 mg Oral QHS Valora Corporal, MD   0.25 mg at 10/18/15 0046  . dextrose 5 % solution   Intravenous Continuous Henreitta Leber, MD 75 mL/hr at 10/18/15 1344    . fluticasone (FLONASE) 50 MCG/ACT nasal spray 1-2 spray  1-2 spray Each Nare Daily Lytle Butte, MD   Stopped at 10/16/15 1000  . haloperidol lactate (HALDOL) injection 0.5 mg  0.5 mg Intravenous Q6H PRN Gonzella Lex, MD   0.5 mg at 10/16/15 2155  . heparin injection 5,000 Units  5,000 Units Subcutaneous 3 times per day Lytle Butte, MD   5,000 Units at 10/18/15 1522  . montelukast (SINGULAIR) tablet 10 mg  10 mg Oral QHS Lytle Butte, MD   10 mg at 10/15/15 2319  . ondansetron (ZOFRAN) tablet 4 mg  4 mg Oral Q6H PRN Lytle Butte, MD       Or  . ondansetron [facility]) injection 4 mg  4 mg Intravenous Q6H PRN Lytle Butte, MD      . vitamin C (ASCORBIC ACID) tablet 500 mg  500 mg Oral Daily Lytle Butte, MD   500 mg at 10/15/15 1040    Musculoskeletal: Strength & Muscle Tone: decreased Gait & Station: unable to stand Patient leans: N/A  Psychiatric Specialty Exam: ROS  Blood pressure 118/88, pulse 53, temperature 97.9 F (36.6 C), temperature source Axillary, resp. rate 18, height 5' 2"  (1.575 m), weight 52.617 kg (116 lb), SpO2 95 %.Body mass index is 21.21 kg/(m^2).  General Appearance: Disheveled  Eye Contact::  Minimal  Speech:  Slow and Slurred  Volume:  Decreased  Mood:  Euthymic  Affect:  Flat  Thought Process:  Minimal expression  Orientation:  Other:  Know she is in the hospital and can say her own name  Thought Content:  Negative  Suicidal Thoughts:  No  Homicidal Thoughts:  No  Memory:  Negative  Judgement:  Negative  Insight:  Negative  Psychomotor Activity:  Decreased  Concentration:  Poor  Recall:  Poor  Fund of  Knowledge:Poor  Language: Poor  Akathisia:  Negative  Handed:  Right  AIMS (if indicated):     Assets:  Social Support  ADL's:  Impaired  Cognition: Impaired,  Moderate and Severe  Sleep:      Treatment Plan Summary: Plan Patient with delirium appears much better than she did yesterday. Yesterday she was obviously confused picking at the air and not responding at all. Today she was able to pay attention and make some verbal and facial response that was appropriate to questions. Quite demented clearly and still a little bit confused. Today she was able to eat which she was not doing yesterday. I still do not see any signs of depression. No indication to start antidepressants. I would minimize any other sedating medicines at this point. No orders done. Follow-up as needed.  Disposition: Patient does not meet criteria for psychiatric inpatient admission. Supportive therapy provided  about ongoing stressors.  John Clapacs 10/18/2015 5:59 PM  P

## 2015-10-18 NOTE — Progress Notes (Signed)
[facility] - [place] at [facility]   PATIENT NAME: Ellen Evans    MR#:  [id number]  DATE OF BIRTH:  1924-07-14  SUBJECTIVE:  CHIEF COMPLAINT:   Chief Complaint  Patient presents with  . Altered Mental Status    per family more confused today and not wanting to walk anywhere. started this am. no other symptoms   Still very lethargic today.  Daughter at bedside.  Taking some bites of apple sauce.  Having episodes of bradycardia with HR dropping to the 30's but quickly recovers.  Asymptomatic.    REVIEW OF SYSTEMS:   ROS unable to obtain  DRUG ALLERGIES:  No Known Allergies  VITALS:  Blood pressure 118/88, pulse 53, temperature 97.9 F (36.6 C), temperature source Axillary, resp. rate 18, height  (1.575 m), weight 52.617 kg (116 lb), SpO2 95 %.  PHYSICAL EXAMINATION:  GENERAL:  81 y.o.-year-old patient lying on her right side, no distress EYES: PERRLA HEENT: Head atraumatic, normocephalic. Oropharynx and nasopharynx clear.  NECK:  Supple, no jugular venous distention. No thyroid enlargement, no tenderness.  LUNGS: Normal breath sounds bilaterally, no wheezing, rales, rhonchi. No use of accessory muscles of respiration.  CARDIOVASCULAR: S1, S2 normal. No murmurs, rubs, or gallops.  ABDOMEN: Soft, nontender, nondistended. Bowel sounds present. No organomegaly or mass.  EXTREMITIES: No pedal edema, cyanosis, or clubbing.  NEUROLOGIC: Refuses to participate in examination, moves all 4 extremities spontaneously , speaks spontaneously but does not answer questions PSYCHIATRIC: Alert, disoriented, calm SKIN: No obvious rash, lesion, or ulcer.    LABORATORY PANEL:   CBC  Recent Labs Lab 10/18/15 0442  WBC 6.4  HGB 10.9*  HCT 32.3*  PLT 142*   ------------------------------------------------------------------------------------------------------------------  Chemistries   Recent Labs Lab 10/17/15 0604 10/18/15 0442  NA 147* 148*  K 3.8  3.8  CL 118* 118*  CO2 22 21*  GLUCOSE 81 94  BUN 17 16  CREATININE 0.99 0.95  CALCIUM 10.0 9.9  AST 31  --   ALT 17  --   ALKPHOS 46  --   BILITOT 0.7  --    ------------------------------------------------------------------------------------------------------------------  Cardiac Enzymes  Recent Labs Lab 10/15/15 1206  TROPONINI 0.04*   ------------------------------------------------------------------------------------------------------------------  RADIOLOGY:  No results found.   ASSESSMENT AND PLAN:   1. Altered mental status: Suspect this is likely secondary to underlying dementia. Seen by neurology and also psychiatry and appreciate input. -As per neurology this could possibly be Lewy body dementia.- CT head shows diffuse small vessel disease and atrophy - TSH normal, ammonia, ABG all normal, B12 normal, Sed rate stable.  - Appreciate neurology consultation. Continue with reorientation measures. Continue amantadine and clonidine.  Avoid Neuroletpics.   2. Urinary tract infection: Urine culture Mixed.  Ruled out. Off abx now.   3. Hypertension: Hold antihypertensives as blood pressure is low.  4. Hypernatremia - likely due to poor PO intake and dehydration.  - will switch fluids to D5W and follow sodium.   All the records are reviewed and case discussed with Care Management/Social Workerr. Management plans discussed with the patient, family and they are in agreement.  CODE STATUS: Full   TOTAL TIME TAKING CARE OF THIS PATIENT: 30 minutes.   POSSIBLE D/C IN 2-3 DAYS, DEPENDING ON CLINICAL CONDITION.  Houston Siren M.D on 10/18/2015 at 4:22 PM  Between 7am to 6pm - Pager - [phone]  After 6pm go to [email or web address] - password [email or web address] [facility]  [place] [facility]  Office  [phone]  CC:  Primary care physician; Mickey FarberHIES, DAVID, MD

## 2015-10-18 NOTE — Progress Notes (Signed)
PT Cancellation Note  Patient Details Name: Ellen Evans MRN: [id number] DOB: [birth year]   Cancelled Treatment:    Reason Eval/Treat Not Completed: Patient's level of consciousness. PT contacted RN who states patient has not been alert or oriented this morning and that patient would be unlikely to participate with PT. PT checked on patient who was not awake, mittens in place. Patient has not been able to participate with PT for 3 consecutive attempts, thus orders will be completed. When patient is more alert/oriented and able to participate with PT, please feel free to re-consult PT services.   Kerin RansomPatrick A McNamara, PT, DPT    10/18/2015, 10:09 AM

## 2015-10-19 DIAGNOSIS — R410 Disorientation, unspecified: Secondary | ICD-10-CM | POA: Diagnosis not present

## 2015-10-19 LAB — BASIC METABOLIC PANEL
ANION GAP: 6 (ref 5–15)
BUN: 17 mg/dL (ref 6–20)
CALCIUM: 9.6 mg/dL (ref 8.9–10.3)
CHLORIDE: 113 mmol/L — AB (ref 101–111)
CO2: 22 mmol/L (ref 22–32)
CREATININE: 0.76 mg/dL (ref 0.44–1.00)
GFR calc non Af Amer: >60 mL/min (ref >60)
Glucose, Bld: 123 mg/dL — ABNORMAL HIGH (ref 65–99)
Potassium: 3.4 mmol/L — ABNORMAL LOW (ref 3.5–5.1)
SODIUM: 141 mmol/L (ref 135–145)

## 2015-10-19 MED ORDER — ENSURE ENLIVE PO LIQD
237.0000 mL | Freq: Two times a day (BID) | ORAL | Status: DC
  Administered 2015-10-20: 237 mL via ORAL

## 2015-10-19 NOTE — Care Management (Signed)
Spoke with daughter Juliann Pulse.  Discussed that patient has not been able to participate with physical therapy and it would be unlikely insurance would approve skilled nursing as so far, patient is requiring custodial care.  Patient has been more alert today.  Reconsulted physical therapy.  Discussed that family will need to come together and make a decision regarding disposition.   Patient can not be left alone if returns home.  Discussed dementia support through [facility].  Also discussed family may need to decide to private pay for facility if care needs can not be met in the home.  Another daughter "is in charge of patient's money." Discussed that patient is medically stable for discharge and it is not medically necessary for her to remain in the hospital  awaiting her ability to participate with physical therapy.  CSW has started a bed search.  Patient's payor is [facility] Medicare

## 2015-10-19 NOTE — Progress Notes (Signed)
NEUROLOGY NOTE  S: Pt is much more awake today and eating and close to normal per daughter. Abnormal movements have resolved  ROS unobtainable due to dementia  O: 99    129/77    113   20 Nl weight, no distress Normocephalic, oropharynx clear Supple, no LAD CTA B, no wheezing RRR, no murmur No C/C/E  Alert and follows more today, improved verbal PERRLA, EOMI, face symmetric 5-/5 B  MRI with small stroke on L parietal lobe  A/P: 1. Probable dementia with some sundowning- further improved with re-orientation; Fluctuations can be seen in Lewy body plus gait changes but it is unclear all of history; All types of dementia are possible except multi-infarct. 2. Mild dyskinesias- resolved, This is likely due to neuroleptics - Hold all Neuroleptics if possible - continue amantadine 100mg  BID - continue clonazepam 0.5mg  qHS - Lights on during the day and up in chair TID - Will sign off, please call with questions -  [facility] for dementia

## 2015-10-19 NOTE — Progress Notes (Signed)
[facility]icians - [place] at [facility]   PATIENT NAME: Ellen Evans    MR#:  [id number]  DATE OF BIRTH:  03/19/1924  SUBJECTIVE:  CHIEF COMPLAINT:   Chief Complaint  Patient presents with  . Altered Mental Status    per family more confused today and not wanting to walk anywhere. started this am. no other symptoms   Patient is a bit more awake today.  Daughter at bedside.  Eating a little bit more but still not enough.   No further episodes of bradycardia overnight.      REVIEW OF SYSTEMS:   ROS unable to obtain  DRUG ALLERGIES:  No Known Allergies  VITALS:  Blood pressure 129/77, pulse 113, temperature 99 F (37.2 C), temperature source Oral, resp. rate 20, height  (1.575 m), weight 52.617 kg (116 lb), SpO2 95 %.  PHYSICAL EXAMINATION:  GENERAL:  81 y.o.-year-old patient lying in bed lethargic but follows simple commands.  EYES: PERRLA, no conjunctival injection.  HEENT: Head atraumatic, normocephalic. Oropharynx and nasopharynx clear.  NECK:  Supple, no jugular venous distention. No thyroid enlargement, no tenderness.  LUNGS: Normal breath sounds bilaterally, no wheezing, rales, rhonchi. No use of accessory muscles of respiration.  CARDIOVASCULAR: S1, S2 normal. No murmurs, rubs, or gallops.  ABDOMEN: Soft, nontender, nondistended. Bowel sounds present. No organomegaly or mass.  EXTREMITIES: No pedal edema, cyanosis, or clubbing.  NEUROLOGIC: Globally weak, moving all extremities spontaneously. No focal motor or sensory deficits. Bilaterally. PSYCHIATRIC: Alert, disoriented, calm SKIN: No obvious rash, lesion, or ulcer.    LABORATORY PANEL:   CBC  Recent Labs Lab 10/18/15 0442  WBC 6.4  HGB 10.9*  HCT 32.3*  PLT 142*   ------------------------------------------------------------------------------------------------------------------  Chemistries   Recent Labs Lab 10/17/15 0604  10/19/15 0417  NA 147*  < > 141  K 3.8  < > 3.4*   CL 118*  < > 113*  CO2 22  < > 22  GLUCOSE 81  < > 123*  BUN 17  < > 17  CREATININE 0.99  < > 0.76  CALCIUM 10.0  < > 9.6  AST 31  --   --   ALT 17  --   --   ALKPHOS 46  --   --   BILITOT 0.7  --   --   < > = values in this interval not displayed. ------------------------------------------------------------------------------------------------------------------  Cardiac Enzymes  Recent Labs Lab 10/15/15 1206  TROPONINI 0.04*   ------------------------------------------------------------------------------------------------------------------  RADIOLOGY:  No results found.   ASSESSMENT AND PLAN:   1. Altered mental status: Suspect this is likely secondary to underlying dementia. Seen by neurology and also psychiatry and appreciate input. -As per neurology this could possibly be Lewy body dementia. - CT head shows diffuse small vessel disease and atrophy - TSH normal, ammonia, ABG all normal, B12 normal, Sed rate stable.  -  Continue with reorientation measures. Continue amantadine and clonidine.  Avoid Neuroletpics. Slow to improve.   2. Urinary tract infection: Urine culture Mixed.  Ruled out. Off abx now.   3. Hypertension: Hold antihypertensives as blood pressure is low.  4. Hypernatremia - likely due to poor PO intake and dehydration.  - improved w/ D5W and will monitor.   5. Adult Failure to thrive - cont. Dietary supplements.  Encourage PO intake.   Dispo - likely to SNF in the next 1-2 days depending on progress.   All the records are reviewed and case discussed with Care Management/Social Workerr.  Management plans discussed with the patient, family and they are in agreement.  CODE STATUS: Full   TOTAL TIME TAKING CARE OF THIS PATIENT: 30 minutes.   POSSIBLE D/C IN 1-2 DAYS, DEPENDING ON CLINICAL CONDITION.  Houston SirenSAINANI,VIVEK J M.D on 10/19/2015 at 3:28 PM  Between 7am to 6pm - Pager - [phone]  After 6pm go to [email or web address] - password [email or web address] [facility]  [facility]  [place] [facility]  Office  [phone]  CC: Primary care physician; Mickey FarberHIES, DAVID, MD

## 2015-10-19 NOTE — Consult Note (Signed)
  Psychiatry: Follow-up for this 81 year old woman with mental status changes. This morning unfortunately she looks worse than she did yesterday. She was able to say her first name but did not interact with me nearly as well as yesterday and has not been eating. I note that she was seen by neurology and that she appears to have a new stroke.  Patient very withdrawn and uncommunicative. Very difficult to assess the possibility of mood disorder although she seems currently still be delirious.  I note the medication changes that neurology has made.  No medication changes at this time as I do not want conflict with what neurology is doing. Will sign off for now. Please reconsult psychiatry if necessary . No change to diagnosis of delirium

## 2015-10-19 NOTE — Progress Notes (Signed)
Initial Nutrition Assessment     INTERVENTION:   Meals and Snacks: recommend liberalizing diet to regular, may benefit from Dysphagia III (easy to chew) diet but family ordering pt meals and plans to pick items from regular menu that pt can toleate, Cater to patient preferences Medical Food Supplement Therapy: recommend Ensure Enlive po BID, each supplement provides 350 kcal and 20 grams of protein  NUTRITION DIAGNOSIS:   Inadequate oral intake related to acute illness as evidenced by meal completion < 25%.  GOAL:   Patient will meet greater than or equal to 90% of their needs  MONITOR:    (Energy Intake, Anthropometrics, Electrolyte/Renal Profile, Cognition)  REASON FOR ASSESSMENT:   LOS    ASSESSMENT:    Pt admitted with acute encephalopathy, delerium; pt withdrawn, uncommunicative per MD note; neurology and psychology following  Past Medical History  Diagnosis Date  . Hypertension   . Vertigo     Diet Order:  Heart Healthy  Energy Intake: recorded po intake 25% or less of meals on average  Food and Nutrition Related History: family reports pt eating well prior to this admission, drinks 1 Ensure daily  Digestive System: poor dentition, needs softer foods per family report  Last BM:  12/8   Glucose Profile: No results for input(s): GLUCAP in the last 72 hours. Electrolyte and Renal Profile:  Recent Labs Lab 10/17/15 0604 10/18/15 0442 10/19/15 0417  BUN 17 16 17   CREATININE 0.99 0.95 0.76  NA 147* 148* 141  K 3.8 3.8 3.4*   Meds: D5 at 75 ml/hr  Nutrition Focused Physical Exam:  Unable to complete Nutrition-Focused physical exam at this time.    Height:   Ht Readings from Last 1 Encounters:  10/14/15 5\' 2"  (1.575 m)    Weight: relatively stable per family report, possible few pound wt loss  Wt Readings from Last 1 Encounters:  10/14/15 116 lb (52.617 kg)    BMI:  Body mass index is 21.21 kg/(m^2).  Estimated Nutritional Needs:   Kcal:   1175-1410 kcals (BEE 904, 1.3 AF, 1.0-1.2 IF)   Protein:  52-62 g (1.0-1.2 g/kg)   Fluid:  1325-1590 mL (25-30 ml/kg)      MODERATE Care Level  Romelle Starcherate Lota Leamer MS, RD, LDN [phone] Pager

## 2015-10-19 NOTE — [place] FL2 (Signed)
[place] MEDICAID FL2 LEVEL OF CARE SCREENING TOOL     IDENTIFICATION  Patient Name: Ellen Evans Birthdate: [birth year] Sex: female Admission Date (Current Location): 10/14/2015  [place]  Air cabin crew[place]   Facility and Address:  [facility], [address], [place], [place] [address]      Provider Number: [id number]  Attending Physician Name and Address:  Houston SirenVivek J Sainani, MD  Relative Name and Phone Number:       Current Level of Care: Hospital Recommended Level of Care: Skilled Nursing Facility Prior Approval Number:    Date Approved/Denied:   PASRR Number:  (.)  Discharge Plan: SNF    Current Diagnoses: Patient Active Problem List   Diagnosis Date Noted  . Encephalopathy acute 10/15/2015  . Acute delirium 10/15/2015  . Encephalopathy 10/14/2015  . UTI (lower urinary tract infection) 10/14/2015  . Atrial fibrillation (HCC) 10/14/2015    Orientation RESPIRATION BLADDER Height & Weight    Self  Normal Incontinent 5\' 2"  (157.5 cm) 115 lbs.  BEHAVIORAL SYMPTOMS/MOOD NEUROLOGICAL BOWEL NUTRITION STATUS      Continent Diet  AMBULATORY STATUS COMMUNICATION OF NEEDS Skin   Extensive Assist Verbally Normal                       Personal Care Assistance Level of Assistance  Bathing, Feeding, Dressing Bathing Assistance: Maximum assistance Feeding assistance: Limited assistance Dressing Assistance: Maximum assistance     Functional Limitations Info  Sight, Hearing, Speech Sight Info: Impaired Hearing Info: Impaired Speech Info: Adequate    SPECIAL CARE FACTORS FREQUENCY  PT (By licensed PT)                    Contractures      Additional Factors Info  Allergies   Allergies Info:  (NKA)           Current Medications (10/19/2015):  This is the current hospital active medication list Current Facility-Administered Medications  Medication Dose Route Frequency Provider Last Rate Last Dose  .  acetaminophen (TYLENOL) tablet 650 mg  650 mg Oral Q6H PRN Wyatt Hasteavid K Hower, MD       Or  . acetaminophen (TYLENOL) suppository 650 mg  650 mg Rectal Q6H PRN Wyatt Hasteavid K Hower, MD      . amantadine (SYMMETREL) solution 100 mg  100 mg Oral BID Mellody DrownMatthew Smith, MD   100 mg at 10/18/15 2136  . aspirin EC tablet 81 mg  81 mg Oral Daily Wyatt Hasteavid K Hower, MD   81 mg at 10/15/15 1040  . clonazePAM (KLONOPIN) tablet 0.25 mg  0.25 mg Oral QHS Mellody DrownMatthew Smith, MD   0.25 mg at 10/18/15 2141  . dextrose 5 % solution   Intravenous Continuous Houston SirenVivek J Sainani, MD 75 mL/hr at 10/19/15 0307    . feeding supplement (ENSURE ENLIVE) (ENSURE ENLIVE) liquid 237 mL  237 mL Oral BID Houston SirenVivek J Sainani, MD      . fluticasone [facility]FLONASE) 50 MCG/ACT nasal spray 1-2 spray  1-2 spray Each Nare Daily Wyatt Hasteavid K Hower, MD   2 spray at 10/19/15 [id number]  . haloperidol lactate (HALDOL) injection 0.5 mg  0.5 mg Intravenous Q6H PRN Audery AmelJohn T Clapacs, MD   0.5 mg at 10/16/15 2155  . heparin injection 5,000 Units  5,000 Units Subcutaneous 3 times per day Wyatt Hasteavid K Hower, MD   5,000 Units at 10/19/15 0604  . montelukast (SINGULAIR) tablet 10 mg  10 mg Oral QHS Cletis Athensavid K  Hower, MD   10 mg at 10/18/15 2140  . ondansetron (ZOFRAN) tablet 4 mg  4 mg Oral Q6H PRN Wyatt Haste, MD       Or  . ondansetron [facility]) injection 4 mg  4 mg Intravenous Q6H PRN Wyatt Haste, MD      . vitamin C (ASCORBIC ACID) tablet 500 mg  500 mg Oral Daily Wyatt Haste, MD   500 mg at 10/15/15 1040     Discharge Medications: Please see discharge summary for a list of discharge medications.  Relevant Imaging Results:  Relevant Lab Results:   Additional Information  ([id number])  [name], LCSW

## 2015-10-19 NOTE — Care Management Obs Status (Signed)
MEDICARE OBSERVATION STATUS NOTIFICATION   Patient Details  Name: Ellen Evans MRN: [id number] Date of Birth: February 08, 1924   Medicare Observation Status Notification Given:  Yes  This notice was given 10/15/2015 under code 44 conditions.  Original was given to patient's daughterOlegario Messier- kathy.  Copy placed in medical record and copy to HIM    Eber HongGreene, Cambell Stanek R, RN 10/19/2015, 5:04 PM

## 2015-10-19 NOTE — Clinical Social Work Note (Addendum)
Clinical Social Work Assessment  Patient Details  Name: Ellen Evans MRN: [id number] Date of Birth: 08-03-1924  Date of referral:  10/19/15               Reason for consult:  Community Resources (possible SNF)                Permission sought to share information with:  Facility Medical sales representativeContact Representative, Family Supports Permission granted to share information::     Name::      (dau Olegario MessierKathy [phone] and Nettie ElmSylvia [phone])  Agency::     Relationship::     Contact Information:     Housing/Transportation Living arrangements for the past 2 months:  Single Family Home Source of Information:  Medical Team, Adult Children Patient Interpreter Needed:  None Criminal Activity/Legal Involvement Pertinent to Current Situation/Hospitalization:  No - Comment as needed Significant Relationships:  Adult Children Lives with:  Self Do you feel safe going back to the place where you live?    Need for family participation in patient care:  Yes (Comment)  Care giving concerns:  dau is concerned that patient is not able to return home in current state   Social Worker assessment / plan:  Visual merchandiserClinical Social Worker (CSW) consult for dementia community resources and possible SNF placement, PT has not been able to complete an assessment on patient, however based on information from family they would like STR to assist with getting patient back to previous level of functioning.    Patient was sleeping soundly Information provided by daughter Olegario MessierKathy at bedside).  CSW introduced self and explained role of CSW department.  Patient currently lives alone.  Olegario MessierKathy checks on patient daily and makes her breakfast.  Daughter Nettie ElmSylvia lives next door to patient but works during the day.   Patient uses a walker when at home and [facility] . CSW explained that PT has not been able to work with patient.  Patient has never been to SNF. CSW reviewed SNF process with patient's daughter, she is agreeable to SNF bed search and  prefers [facility].      CSW provided daughter with community resources for dementia , will complete FL2, and fax out for available bed in anticipation of discharging to SNF for rehab.   Employment status:  Disabled (Comment on whether or not currently receiving Disability) Insurance information:  Managed Medicare PT Recommendations:  Not assessed at this time (PT unable to complete assessment) Information / Referral to community resources:  Skilled Holiday representativeursing Facility, Other (Comment Required) (Dementia information, )  Patient/Family's Response to care:  Patient was appreciative of information provided by CSW and will review SNF list with her sister.  Patient/Family's Understanding of and Emotional Response to Diagnosis, Current Treatment, and Prognosis:  Patient's daughter understands that patient is under continued medical work up at this time.  Once medically stable she will discharge back home with home health or to SNF for rehab.  Emotional Assessment Appearance:  Appears stated age Attitude/Demeanor/Rapport:    Affect (typically observed):  Unable to Assess Orientation:   (unable to assess) Alcohol / Substance use:  Never Used Psych involvement (Current and /or in the community):  Yes (Comment)  Discharge Needs  Concerns to be addressed:  Care Coordination, Discharge Planning Concerns, Lack of Support Readmission within the last 30 days:  No Current discharge risk:  Chronically ill, Dependent with Mobility Barriers to Discharge:  Continued Medical Work up   The ServiceMaster CompanyMoore, Tamantha Saline H, LCSW 10/19/2015, 2:12 PM

## 2015-10-20 NOTE — Evaluation (Addendum)
Physical Therapy Evaluation Patient Details Name: Ellen Evans MRN: [id number] DOB: 09-05-1924 Today's Date: 10/20/2015   History of Present Illness  Patient is an 81 y/o female that has previously been living independently with assistance PRN throughout the day from her daughter who lives next door. Patient has had altered mental status throughout this admission.   Clinical Impression  Patient is an 81 y/o female that has previously lived independently prior to this admission. However, on this admission she has had altered mental status and was inappropriate for PT evaluation on several attempts. On this attempt patient required max A x 1 to maintain any semblance of sitting balance, per RN staff she was essentially a dependent transfer to chair earlier, though she was unable to maintain her hands on the walker during this evaluation. Patient was unable to follow any commands in this session and appeared very confused, picking at her gown with her fingers throughout the session. At this time patient is not appropriate for skilled rehab as she cannot participate in therapy, she is also not appropriate to return to her previous living arrangements. Would recommend 24 hour skilled care as patient is unable to care for herself or safely mobilize without significant/dependent assistance. Acute PT services will provide a 3 day trial, though if patient continues to be unable to participate in therapy these orders will be discontinued again.     Follow Up Recommendations Other (comment) (See assessment.)    Equipment Recommendations       Recommendations for Other Services       Precautions / Restrictions Precautions Precautions: Fall Restrictions Weight Bearing Restrictions: No      Mobility  Bed Mobility Overal bed mobility: Needs Assistance Bed Mobility: Supine to Sit;Sit to Supine     Supine to sit: Mod assist;Max assist Sit to supine: Mod assist;Max assist   General bed mobility  comments: Patient initially attempting to bring her LEs off the edge of the bed, she is unable to elevate her trunk or provide any assistance on return to bed aside from gravity.   Transfers                 General transfer comment: Deferred   Ambulation/Gait                Stairs            Wheelchair Mobility    Modified Rankin (Stroke Patients Only)       Balance Overall balance assessment: Needs assistance Sitting-balance support: Feet unsupported;Bilateral upper extremity supported Sitting balance-Leahy Scale: Zero Sitting balance - Comments: Patient with heavy posterior and R sided lean with no ability to maintain herself upright.  Postural control: Right lateral lean;Posterior lean                                   Pertinent Vitals/Pain Pain Assessment: Faces Faces Pain Scale: No hurt    Home Living Family/patient expects to be discharged to:: Unsure Living Arrangements: Alone Available Help at Discharge: Family;Available PRN/intermittently Type of Home: House         Home Equipment: Walker - 2 wheels      Prior Function Level of Independence: Independent with assistive device(s)         Comments: Per daughter patient has been ambulating with a RW in her home.      Hand Dominance        Extremity/Trunk Assessment  Upper Extremity Assessment: Difficult to assess due to impaired cognition           Lower Extremity Assessment: Difficult to assess due to impaired cognition         Communication   Communication: HOH  Cognition Arousal/Alertness: Suspect due to medications Behavior During Therapy: Restless Overall Cognitive Status: Impaired/Different from baseline Area of Impairment: Orientation;Awareness;Following commands Orientation Level: Disoriented to;Person;Place;Time;Situation     Following Commands:  (Does not follow commands)       General Comments: Patient mumbles throughout session,  however no ability to follow commands demonstrated.     General Comments      Exercises        Assessment/Plan    PT Assessment Patient needs continued PT services  PT Diagnosis Difficulty walking;Altered mental status;Generalized weakness   PT Problem List Decreased strength;Decreased mobility;Decreased safety awareness;Decreased knowledge of precautions;Decreased cognition;Decreased knowledge of use of DME;Decreased balance;Decreased activity tolerance  PT Treatment Interventions DME instruction;Therapeutic activities;Therapeutic exercise;Gait training;Balance training   PT Goals (Current goals can be found in the Care Plan section) Acute Rehab PT Goals Patient Stated Goal: To return to normal cognitive status  PT Goal Formulation: With family Time For Goal Achievement: 11/03/15 Potential to Achieve Goals: Poor    Frequency Min 2X/week (3 day trial)   Barriers to discharge Decreased caregiver support Patient lives alone.    Co-evaluation               End of Session   Activity Tolerance:  (Limited by cognitive status/ability to participate.) Patient left: in bed;with bed alarm set;with call bell/phone within reach;with family/visitor present Nurse Communication: Mobility status (Cognitive status)    Functional Assessment Tool Used: Clinical Judgement  Functional Limitation: Mobility: Walking and moving around Mobility: Walking and Moving Around Current Status ([id number]([id number]): At least 80 percent but less than 100 percent impaired, limited or restricted Mobility: Walking and Moving Around Goal Status [phone]([id number]): At least 60 percent but less than 80 percent impaired, limited or restricted    Time: [id number] PT Time Calculation (min) (ACUTE ONLY): 10 min   Charges:   PT Evaluation $Initial PT Evaluation Tier I: 1 Procedure     PT G Codes:   PT G-Codes **NOT FOR INPATIENT CLASS** Functional Assessment Tool Used: Clinical Judgement  Functional Limitation: Mobility:  Walking and moving around Mobility: Walking and Moving Around Current Status ([id number](G8978): At least 80 percent but less than 100 percent impaired, limited or restricted Mobility: Walking and Moving Around Goal Status [phone]([id number]): At least 60 percent but less than 80 percent impaired, limited or restricted   Kerin RansomPatrick A Swati Granberry, PT, DPT    10/20/2015, 3:01 PM  Addendum - late add of g-codes Kerin RansomPatrick A Thorin Starner, PT, DPT

## 2015-10-20 NOTE — Progress Notes (Signed)
Patient has rested quietly tonight. No complaints of pain and no signs of discomfort or distress noted. Updated patient's daughter on how patient did overnight. Nursing staff will continue to monitor. Lamonte RicherKara A Finlay Mills, RN

## 2015-10-20 NOTE — Progress Notes (Signed)
Patient ID: Ellen Evans, female   DOB: [birth year], 81 y.o.   MRN: [id number] [facility] PROGRESS NOTE  PCP: Mickey Farber, MD  HPI/Subjective: Patient initially was sleeping with her eyes open. Daughter stimulated her and then she got upset. She did not answer all questions that I asked.  Objective: Filed Vitals:   10/20/15 0610 10/20/15 0755  BP: 122/61 136/67  Pulse: 93 91  Temp: 97.7 F (36.5 C) 97.7 F (36.5 C)  Resp: 18 18    Filed Weights   10/14/15 1626 10/14/15 2350  Weight: 52.164 kg (115 lb) 52.617 kg (116 lb)    ROS: Review of Systems  Unable to perform ROS  secondary to altered mental status Exam: Physical Exam  Constitutional: She appears lethargic.  HENT:  Nose: No mucosal edema.  Eyes: Conjunctivae, EOM and lids are normal. Pupils are equal, round, and reactive to light.  Neck: No JVD present. Carotid bruit is not present. No edema present. No thyroid mass and no thyromegaly present.  Cardiovascular: S1 normal and S2 normal.  Exam reveals no gallop.   No murmur heard. Pulses:      Dorsalis pedis pulses are 2+ on the right side, and 2+ on the left side.  Respiratory: No respiratory distress. She has no wheezes. She has no rhonchi. She has no rales.  GI: Soft. Bowel sounds are normal. There is no tenderness.  Musculoskeletal:       Right ankle: She exhibits swelling.       Left ankle: She exhibits swelling.  Lymphadenopathy:    She has no cervical adenopathy.  Neurological: She appears lethargic.  Skin: Skin is warm. No rash noted. Nails show no clubbing.  Psychiatric: Her affect is blunt.    Data Reviewed: Basic Metabolic Panel:  Recent Labs Lab 10/15/15 0537 10/16/15 0532 10/17/15 0604 10/18/15 0442 10/19/15 0417  NA 140 140 147* 148* 141  K 3.4* 3.0* 3.8 3.8 3.4*  CL 109 114* 118* 118* 113*  CO2 21* 22  GLUCOSE 87 78 81 94 123*  BUN 25* CREATININE 0.95 0.94 0.99 0.95 0.76  CALCIUM 10.0 9.0 10.0 9.9  9.6   Liver Function Tests:  Recent Labs Lab 10/14/15 1942 10/17/15 0604  AST 25 31  ALT 14 17  ALKPHOS 56 46  BILITOT 0.7 0.7  PROT 7.2 5.9*  ALBUMIN 3.8 3.2*    Recent Labs Lab 10/16/15 1753  AMMONIA 10   CBC:  Recent Labs Lab 10/14/15 1942 10/15/15 0537 10/16/15 0532 10/17/15 0604 10/18/15 0442  WBC 10.7 6.6 4.3 7.4 6.4  NEUTROABS 8.4*  --   --   --   --   HGB 12.8 10.8* 8.4* 10.6* 10.9*  HCT 38.8 31.9* 26.6* 32.8* 32.3*  MCV 92.4 91.8 95.7 92.9 93.5  PLT 229 190 144* 174 142*   Cardiac Enzymes:  Recent Labs Lab 10/14/15 1942 10/15/15 0012 10/15/15 0537 10/15/15 1206 10/17/15 0604  CKTOTAL  --   --   --   --  248*  TROPONINI 0.05* 0.04* 0.04* 0.04*  --      Recent Results (from the past 240 hour(s))  Urine culture     Status: None   Collection Time: 10/14/15  5:45 PM  Result Value Ref Range Status   Specimen Description URINE, RANDOM  Final   Special Requests NONE  Final   Culture MULTIPLE SPECIES PRESENT, SUGGEST RECOLLECTION  Final   Report Status 10/16/2015 FINAL  Final  Urine culture     Status: None   Collection Time: 10/16/15  5:15 PM  Result Value Ref Range Status   Specimen Description URINE, RANDOM  Final   Special Requests Normal  Final   Culture NO GROWTH 1 DAY  Final   Report Status 10/18/2015 FINAL  Final  Culture, blood (routine x 2)     Status: None (Preliminary result)   Collection Time: 10/16/15  5:53 PM  Result Value Ref Range Status   Specimen Description BLOOD RIGHT ASSIST CONTROL  Final   Special Requests BAA,ANA,AER,5ML  Final   Culture NO GROWTH 4 DAYS  Final   Report Status PENDING  Incomplete  Culture, blood (routine x 2)     Status: None (Preliminary result)   Collection Time: 10/16/15  5:53 PM  Result Value Ref Range Status   Specimen Description BLOOD RIGHT ARM  Final   Special Requests BAA,5ML,ANA,AER  Final   Culture NO GROWTH 4 DAYS  Final   Report Status PENDING  Incomplete     Scheduled Meds: .  amantadine  100 mg Oral BID  . aspirin EC  81 mg Oral Daily  . clonazePAM  0.25 mg Oral QHS  . feeding supplement (ENSURE ENLIVE)  237 mL Oral BID  . fluticasone  1-2 spray Each Nare Daily  . heparin  5,000 Units Subcutaneous 3 times per day  . montelukast  10 mg Oral QHS  . vitamin C  500 mg Oral Daily   Continuous Infusions: . dextrose 30 mL/hr at 10/20/15 0800    Assessment/Plan:  1. Acute encephalopathy- likely delirium on top of dementia. Avoid Haldol and sedative medications. Patient received medications and has not been the same since. Neurology and psychiatry following. Patient was placed on amantadine. 2. Acute cystitis without hematuria- patient receive antibiotics while here. Urine culture multiple organisms. 3. Failure to thrive 4. Weakness 5. History of essential hypertension 6. History of hyperlipidemia  Code Status:     Code Status Orders        Start     Ordered   10/14/15 2209  Full code   Continuous     10/14/15 2209     Family Communication: Daughter at bedside Disposition Plan: To be determined depending on how she does with physical therapy  Consultants:  Neurology  Psychiatry  Time spent: 25 minutes  Alford HighlandWIETING, Lyanna Blystone  [facility]

## 2015-10-21 LAB — CULTURE, BLOOD (ROUTINE X 2)
CULTURE: NO GROWTH
Culture: NO GROWTH

## 2015-10-21 NOTE — Progress Notes (Signed)
Patient ID: Ellen Evans, female   DOB: [birth year], 81 y.o.   MRN: [id number] [facility] PROGRESS NOTE  PCP: Mickey FarberHIES, DAVID, MD  HPI/Subjective: I was unable to wake the patient up with 3 separate sternal rubs. Daughter unable to feed her very much over the last couple days.  Objective: Filed Vitals:   10/21/15 0553 10/21/15 0817  BP: 130/87 134/84  Pulse: 73 78  Temp: 97.9 F (36.6 C) 97.8 F (36.6 C)  Resp: 18 16    Filed Weights   10/14/15 1626 10/14/15 2350  Weight: 52.164 kg (115 lb) 52.617 kg (116 lb)    ROS: Review of Systems  Unable to perform ROS  secondary to altered mental status Exam: Physical Exam  Constitutional: She appears lethargic.  HENT:  Nose: No mucosal edema.  Eyes: Conjunctivae, EOM and lids are normal. Pupils are equal, round, and reactive to light.  Neck: No JVD present. Carotid bruit is not present. No edema present. No thyroid mass and no thyromegaly present.  Cardiovascular: S1 normal and S2 normal.  Exam reveals no gallop.   No murmur heard. Pulses:      Dorsalis pedis pulses are 2+ on the right side, and 2+ on the left side.  Respiratory: No respiratory distress. She has no wheezes. She has no rhonchi. She has no rales.  GI: Soft. Bowel sounds are normal. There is no tenderness.  Musculoskeletal:       Right ankle: She exhibits swelling.       Left ankle: She exhibits swelling.  Lymphadenopathy:    She has no cervical adenopathy.  Neurological: She appears lethargic.  Skin: Skin is warm. No rash noted. Nails show no clubbing.  Psychiatric: Her affect is blunt.    Data Reviewed: Basic Metabolic Panel:  Recent Labs Lab 10/15/15 0537 10/16/15 0532 10/17/15 0604 10/18/15 0442 10/19/15 0417  NA 140 140 147* 148* 141  K 3.4* 3.0* 3.8 3.8 3.4*  CL 109 114* 118* 118* 113*  CO2 26 22 22  21* 22  GLUCOSE 87 78 81 94 123*  BUN 25* 17 17 16 17   CREATININE 0.95 0.94 0.99 0.95 0.76  CALCIUM 10.0 9.0 10.0 9.9 9.6   Liver  Function Tests:  Recent Labs Lab 10/14/15 1942 10/17/15 0604  AST 25 31  ALT 14 17  ALKPHOS 56 46  BILITOT 0.7 0.7  PROT 7.2 5.9*  ALBUMIN 3.8 3.2*    Recent Labs Lab 10/16/15 1753  AMMONIA 10   CBC:  Recent Labs Lab 10/14/15 1942 10/15/15 0537 10/16/15 0532 10/17/15 0604 10/18/15 0442  WBC 10.7 6.6 4.3 7.4 6.4  NEUTROABS 8.4*  --   --   --   --   HGB 12.8 10.8* 8.4* 10.6* 10.9*  HCT 38.8 31.9* 26.6* 32.8* 32.3*  MCV 92.4 91.8 95.7 92.9 93.5  PLT 229 190 144* 174 142*   Cardiac Enzymes:  Recent Labs Lab 10/14/15 1942 10/15/15 0012 10/15/15 0537 10/15/15 1206 10/17/15 0604  CKTOTAL  --   --   --   --  248*  TROPONINI 0.05* 0.04* 0.04* 0.04*  --      Recent Results (from the past 240 hour(s))  Urine culture     Status: None   Collection Time: 10/14/15  5:45 PM  Result Value Ref Range Status   Specimen Description URINE, RANDOM  Final   Special Requests NONE  Final   Culture MULTIPLE SPECIES PRESENT, SUGGEST RECOLLECTION  Final   Report Status 10/16/2015 FINAL  Final  Urine culture     Status: None   Collection Time: 10/16/15  5:15 PM  Result Value Ref Range Status   Specimen Description URINE, RANDOM  Final   Special Requests Normal  Final   Culture NO GROWTH 1 DAY  Final   Report Status 10/18/2015 FINAL  Final  Culture, blood (routine x 2)     Status: None   Collection Time: 10/16/15  5:53 PM  Result Value Ref Range Status   Specimen Description BLOOD RIGHT ASSIST CONTROL  Final   Special Requests BAA,ANA,AER,5ML  Final   Culture NO GROWTH 5 DAYS  Final   Report Status 10/21/2015 FINAL  Final  Culture, blood (routine x 2)     Status: None   Collection Time: 10/16/15  5:53 PM  Result Value Ref Range Status   Specimen Description BLOOD RIGHT ARM  Final   Special Requests BAA,5ML,ANA,AER  Final   Culture NO GROWTH 5 DAYS  Final   Report Status 10/21/2015 FINAL  Final     Scheduled Meds: . amantadine  100 mg Oral BID  . aspirin EC  81 mg  Oral Daily  . clonazePAM  0.25 mg Oral QHS  . feeding supplement (ENSURE ENLIVE)  237 mL Oral BID  . fluticasone  1-2 spray Each Nare Daily  . heparin  5,000 Units Subcutaneous 3 times per day  . montelukast  10 mg Oral QHS  . vitamin C  500 mg Oral Daily   Continuous Infusions: . dextrose 30 mL/hr at 10/20/15 2333    Assessment/Plan:  1. Acute encephalopathy- likely delirium on top of dementia. Avoid Haldol and sedative medications. Patient received medications and has not been the same since. Neurology and psychiatry following. Patient was placed on amantadine. 2. Acute cystitis without hematuria- patient receive antibiotics while here. Urine culture multiple organisms. 3. Failure to thrive- family interested in hospice referral I will put in for care manager to talk to them about that on Monday morning. Could be a candidate for the hospice home with failure to thrive and not eating very well. 4. Weakness- right now unable to protect his 8 with physical therapy 5. History of essential hypertension 6. History of hyperlipidemia  Code Status:     Code Status Orders        Start     Ordered   10/14/15 2209  Full code   Continuous     10/14/15 2209     Family Communication: Daughter at bedside Disposition Plan: Could be a potential hospice home candidate versus home with hospice. Another option could be long-term placement but since the patient is not eating would probably be better candidate for hospice.  Consultants:  Neurology  Psychiatry  Time spent: 25 minutes  Alford Highland  [facility]

## 2015-10-21 NOTE — Progress Notes (Addendum)
Patient evaluated by PT, does not appear that patient is appropriate for rehab due to not being able to participate.  CSW met with patient's daughter Ellen Evans at bedside.  She understands rehab may not be an option for patient and is willing to private pay for long-term care as she states she is unable to care for patient at home.  Ellen Evans would like CSW to contact [facility].    CSW will contact facilities for bed availability, after Hospice consult, also provided family with resources and information on long-term care Medicaid application process and Dementia support services.   Ellen Evans. LCSWA, MSW Clinical Social Work Department [phone] 10:15 AM 

## 2015-10-22 DIAGNOSIS — R338 Other retention of urine: Secondary | ICD-10-CM | POA: Diagnosis not present

## 2015-10-22 DIAGNOSIS — F0390 Unspecified dementia without behavioral disturbance: Secondary | ICD-10-CM

## 2015-10-22 DIAGNOSIS — E46 Unspecified protein-calorie malnutrition: Secondary | ICD-10-CM

## 2015-10-22 DIAGNOSIS — G2401 Drug induced subacute dyskinesia: Secondary | ICD-10-CM

## 2015-10-22 DIAGNOSIS — N390 Urinary tract infection, site not specified: Secondary | ICD-10-CM | POA: Diagnosis not present

## 2015-10-22 DIAGNOSIS — E876 Hypokalemia: Secondary | ICD-10-CM

## 2015-10-22 DIAGNOSIS — D649 Anemia, unspecified: Secondary | ICD-10-CM

## 2015-10-22 DIAGNOSIS — R000 Tachycardia, unspecified: Secondary | ICD-10-CM

## 2015-10-22 DIAGNOSIS — R001 Bradycardia, unspecified: Secondary | ICD-10-CM

## 2015-10-22 DIAGNOSIS — F05 Delirium due to known physiological condition: Secondary | ICD-10-CM

## 2015-10-22 DIAGNOSIS — I10 Essential (primary) hypertension: Secondary | ICD-10-CM

## 2015-10-22 DIAGNOSIS — R630 Anorexia: Secondary | ICD-10-CM

## 2015-10-22 MED ORDER — GLYCOPYRROLATE 1 MG PO TABS: 1.0000 mg | ORAL_TABLET | Freq: Three times a day (TID) | ORAL | Status: AC | PRN

## 2015-10-22 MED ORDER — MORPHINE SULFATE (CONCENTRATE) 10 MG /0.5 ML PO SOLN: 5.0000 mg | ORAL | Status: AC | PRN

## 2015-10-22 MED ORDER — ACETAMINOPHEN 650 MG RE SUPP: 650.0000 mg | Freq: Four times a day (QID) | RECTAL | Status: AC | PRN

## 2015-10-22 MED ORDER — LORAZEPAM 0.5 MG PO TABS: 0.5000 mg | ORAL_TABLET | ORAL | Status: AC | PRN

## 2015-10-22 MED ORDER — CHLORHEXIDINE GLUCONATE 0.12 % MT SOLN
15.0000 mL | Freq: Two times a day (BID) | OROMUCOSAL | Status: DC
  Administered 2015-10-22: 15 mL via OROMUCOSAL

## 2015-10-22 MED ORDER — PROCHLORPERAZINE 25 MG RE SUPP: 25.0000 mg | Freq: Three times a day (TID) | RECTAL | Status: AC | PRN

## 2015-10-22 MED ORDER — BISACODYL 10 MG RE SUPP: 10.0000 mg | Freq: Every day | RECTAL | Status: AC | PRN

## 2015-10-22 MED ORDER — CETYLPYRIDINIUM CHLORIDE 0.05 % MT LIQD
7.0000 mL | Freq: Two times a day (BID) | OROMUCOSAL | Status: DC
  Administered 2015-10-22 (×2): 7 mL via OROMUCOSAL

## 2015-10-22 MED ORDER — LORAZEPAM 2 MG/ML IJ SOLN: 1.0000 mg | INTRAMUSCULAR | Status: DC | PRN

## 2015-10-22 MED ORDER — AMANTADINE HCL 50 MG/5ML PO SYRP: 100.0000 mg | ORAL_SOLUTION | Freq: Two times a day (BID) | ORAL | Status: DC

## 2015-10-22 MED ORDER — MORPHINE SULFATE (CONCENTRATE) 10 MG/0.5ML PO SOLN
5.0000 mg | ORAL | Status: DC | PRN
  Administered 2015-10-22: 5 mg via ORAL
  Filled 2015-10-22: qty 1

## 2015-10-22 NOTE — Progress Notes (Signed)
Physical Therapy Treatment Patient Details Name: Ellen Evans MRN: [id number] DOB: 10-26-1924 Today's Date: 10/22/2015    History of Present Illness Patient is an 81 y/o female that has previously been living independently with assistance PRN throughout the day from her daughter who lives next door. Patient has had altered mental status throughout this admission.     PT Comments    Patient slightly more verbal in this session, though she continues to be unable to follow any commands. She is unable to provide assistance during transfers in this session and is unable to maintain any semblance of sitting balance without PT support. Patient is still unable to participate in PT services, and thus not a candidate for rehab currently. PT currently on 3 day trial and will make one more attempt with patient prior to completing current orders for the second time for the above mentioned reasons.   Follow Up Recommendations  Other (comment) (See assessment)     Equipment Recommendations       Recommendations for Other Services       Precautions / Restrictions Precautions Precautions: Fall Restrictions Weight Bearing Restrictions: No    Mobility  Bed Mobility Overal bed mobility: Needs Assistance Bed Mobility: Supine to Sit;Sit to Supine     Supine to sit: Total assist Sit to supine: Total assist   General bed mobility comments: Patient is unable to follow commands or provide any consistent effort with bed mobility.   Transfers                 General transfer comment: Deferred   Ambulation/Gait                 Stairs            Wheelchair Mobility    Modified Rankin (Stroke Patients Only)       Balance Overall balance assessment: Needs assistance Sitting-balance support: Feet supported Sitting balance-Leahy Scale: Zero Sitting balance - Comments: Patient leans posteriorly, and once transferred to sitting begins flailing her LEs. Poor trunk control  and no ability to follow commands.                             Cognition Arousal/Alertness: Suspect due to medications Behavior During Therapy: Restless Overall Cognitive Status: Impaired/Different from baseline Area of Impairment: Orientation;Awareness;Following commands Orientation Level: Disoriented to;Person;Place;Time;Situation     Following Commands:  (Does not follow commands)       General Comments: Patient mumbles throughout session, however no ability to follow commands demonstrated.     Exercises      General Comments        Pertinent Vitals/Pain Faces Pain Scale: No hurt    Home Living                      Prior Function            PT Goals (current goals can now be found in the care plan section) Acute Rehab PT Goals Patient Stated Goal: To return to normal cognitive status  PT Goal Formulation: With family Time For Goal Achievement: 11/03/15 Potential to Achieve Goals: Poor Progress towards PT goals: Not progressing toward goals - comment (More verbal today, no ability to follow commands)    Frequency  Min 2X/week (3 day trial)    PT Plan      Co-evaluation             End of  Session   Activity Tolerance:  (Limited by inability to follow commands.) Patient left: in bed;with bed alarm set;with call bell/phone within reach;with family/visitor present     Time: 0902-0912 PT Time Calculation (min) (ACUTE ONLY): 10 min  Charges:  $Therapeutic Activity: 8-22 mins                    G Codes:      Kerin Ransom, PT, DPT    10/22/2015, 9:23 AM

## 2015-10-22 NOTE — Care Management (Signed)
patient does meet criteria for hospice home and to discharge to facility today.  Family all in agreement

## 2015-10-22 NOTE — Discharge Summary (Signed)
[facility] [place] at [facility]   PATIENT NAME: Ellen Evans    MR#:  [id number]  DATE OF BIRTH:  [birth year]  DATE OF ADMISSION:  10/14/2015 ADMITTING PHYSICIAN: Wyatt Haste, MD  DATE OF DISCHARGE: 10/22/2015  PRIMARY CARE PHYSICIAN: Mickey Farber, MD    ADMISSION DIAGNOSIS:  Weakness [R53.1] UTI (lower urinary tract infection) [N39.0]   DISCHARGE DIAGNOSIS:  Acute encephalopathy- likely delirium on top of dementia. Acute cystitis without hematuria Failure to thrive SECONDARY DIAGNOSIS:   Past Medical History  Diagnosis Date  . Hypertension   . Vertigo     HOSPITAL COURSE:   1. Acute encephalopathy- likely delirium on top of dementia. Avoid Haldol and sedative medications. Patient received medications and has not been the same since. Neurology and psychiatry following. Patient was placed on amantadine. 2. Acute cystitis without hematuria- patient receive antibiotics. Urine culture multiple organisms. 3. Failure to thrive- family interested in hospice. The patient cannot eat and take by mouth medication per RN and her daughter. 4. Weakness. 5. History of essential hypertension 6. History of hyperlipidemia  DISCHARGE CONDITIONS:   Poor prognosis, discharge to hospice home today.  CONSULTS OBTAINED:  Treatment Team:  Wyatt Haste, MD Audery Amel, MD  DRUG ALLERGIES:  No Known Allergies  DISCHARGE MEDICATIONS:   Current Discharge Medication List    START taking these medications   Details  amantadine (SYMMETREL) 50 MG/5ML solution Take 10 mLs (100 mg total) by mouth 2 (two) times daily. Qty: 140 mL, Refills: 0      CONTINUE these medications which have NOT CHANGED   Details  fluticasone (FLONASE) 50 MCG/ACT nasal spray Place 1-2 sprays into both nostrils daily.      STOP taking these medications     aspirin EC 81 MG tablet      calcium-vitamin D ([employer] [facility] CALCIUM-VIT D) 500-200 MG-UNIT tablet      lisinopril-hydrochlorothiazide (PRINZIDE,ZESTORETIC) 20-12.5 MG tablet      montelukast (SINGULAIR) 10 MG tablet      pravastatin (PRAVACHOL) 10 MG tablet      vitamin C (ASCORBIC ACID) 500 MG tablet          DISCHARGE INSTRUCTIONS:    If you experience worsening of your admission symptoms, develop shortness of breath, life threatening emergency, suicidal or homicidal thoughts you must seek medical attention immediately by calling 911 or calling your MD immediately  if symptoms less severe.  You Must read complete instructions/literature along with all the possible adverse reactions/side effects for all the Medicines you take and that have been prescribed to you. Take any new Medicines after you have completely understood and accept all the possible adverse reactions/side effects.   Please note  You were cared for by a hospitalist during your hospital stay. If you have any questions about your discharge medications or the care you received while you were in the hospital after you are discharged, you can call the unit and asked to speak with the hospitalist on call if the hospitalist that took care of you is not available. Once you are discharged, your primary care physician will handle any further medical issues. Please note that NO REFILLS for any discharge medications will be authorized once you are discharged, as it is imperative that you return to your primary care physician (or establish a relationship with a primary care physician if you do not have one) for your aftercare needs so that they can reassess your need for medications and  monitor your lab values.    Today   SUBJECTIVE   Demented  And non-communicative.   VITAL SIGNS:  Blood pressure 145/110, pulse 90, temperature 97.4 F (36.3 C), temperature source Oral, resp. rate 18, height  (1.575 m), weight 52.617 kg (116 lb), SpO2 96 %.  I/O:  No intake or output data in the 24 hours ending 10/22/15 1736  PHYSICAL  EXAMINATION:  GENERAL:  81 y.o.-year-old patient lying in the bed with no acute distress.  EYES: No scleral icterus. Extraocular muscles intact.  HEENT: Head atraumatic, normocephalic. NECK:  Supple, no jugular venous distention. No thyroid enlargement, no tenderness.  LUNGS: Normal breath sounds bilaterally, no wheezing, rales,rhonchi or crepitation. No use of accessory muscles of respiration.  CARDIOVASCULAR: S1, S2 normal. No murmurs, rubs, or gallops.  ABDOMEN: Soft, non-tender, non-distended. Bowel sounds present. No organomegaly or mass.  EXTREMITIES: No pedal edema, cyanosis, or clubbing.  NEUROLOGIC: unable to exam. PSYCHIATRIC: The patient is demented and nonverbal.  SKIN: No obvious rash, lesion, or ulcer.   DATA REVIEW:   CBC  Recent Labs Lab 10/18/15 0442  WBC 6.4  HGB 10.9*  HCT 32.3*  PLT 142*    Chemistries   Recent Labs Lab 10/17/15 0604  10/19/15 0417  NA 147*  < > 141  K 3.8  < > 3.4*  CL 118*  < > 113*  CO2 22  < > 22  GLUCOSE 81  < > 123*  BUN 17  < > 17  CREATININE 0.99  < > 0.76  CALCIUM 10.0  < > 9.6  AST 31  --   --   ALT 17  --   --   ALKPHOS 46  --   --   BILITOT 0.7  --   --   < > = values in this interval not displayed.  Cardiac Enzymes No results for input(s): TROPONINI in the last 168 hours.  Microbiology Results  Results for orders placed or performed during the hospital encounter of 10/14/15  Urine culture     Status: None   Collection Time: 10/14/15  5:45 PM  Result Value Ref Range Status   Specimen Description URINE, RANDOM  Final   Special Requests NONE  Final   Culture MULTIPLE SPECIES PRESENT, SUGGEST RECOLLECTION  Final   Report Status 10/16/2015 FINAL  Final  Urine culture     Status: None   Collection Time: 10/16/15  5:15 PM  Result Value Ref Range Status   Specimen Description URINE, RANDOM  Final   Special Requests Normal  Final   Culture NO GROWTH 1 DAY  Final   Report Status 10/18/2015 FINAL  Final  Culture,  blood (routine x 2)     Status: None   Collection Time: 10/16/15  5:53 PM  Result Value Ref Range Status   Specimen Description BLOOD RIGHT ASSIST CONTROL  Final   Special Requests BAA,ANA,AER,5ML  Final   Culture NO GROWTH 5 DAYS  Final   Report Status 10/21/2015 FINAL  Final  Culture, blood (routine x 2)     Status: None   Collection Time: 10/16/15  5:53 PM  Result Value Ref Range Status   Specimen Description BLOOD RIGHT ARM  Final   Special Requests BAA,5ML,ANA,AER  Final   Culture NO GROWTH 5 DAYS  Final   Report Status 10/21/2015 FINAL  Final    RADIOLOGY:  No results found.      Management plans discussed with the patient, family and they  are in agreement.  CODE STATUS:     Code Status Orders        Start     Ordered   10/21/15 1015  Do not attempt resuscitation (DNR)   Continuous    Question Answer Comment  In the event of cardiac or respiratory ARREST Do not call a "code blue"   In the event of cardiac or respiratory ARREST Do not perform Intubation, CPR, defibrillation or ACLS   In the event of cardiac or respiratory ARREST Use medication by any route, position, wound care, and other measures to relive pain and suffering. May use oxygen, suction and manual treatment of airway obstruction as needed for comfort.   Comments nurse may pronounce      10/21/15 1014      TOTAL TIME TAKING CARE OF THIS PATIENT: 37  minutes.  Discussed with patient's daughter, case Production designer, theatre/television/filmmanager and Child psychotherapistsocial worker.  Shaune Pollackhen, Nthony Lefferts M.D on 10/22/2015 at 5:36 PM  Between 7am to 6pm - Pager - [phone]  After 6pm go to [email or web address] - password [email or web address] [facility]  [facility]  Office  [phone]  CC: Primary care physician; Mickey FarberHIES, DAVID, MD

## 2015-10-22 NOTE — Progress Notes (Signed)
Pt picked up by EMS transporters. HR elevated.  MD notified. Acknowledged. Pt appeared to be anxious and fidgeting. Medication given. Family at bedside. Pt belongings given to daughters. All paperwork completed by palliative care.

## 2015-10-22 NOTE — Care Management (Signed)
Informed by primary nurse that patient has not eatten since Friday and minimal urinary output.  There is discussion as to whether patient would now meet criteria for hospice home.  Discussed palliative care consult and order has been placed.    Paged Dr Orvan Falconerampbell to address specifics of referral

## 2015-10-22 NOTE — Consult Note (Signed)
Palliative Medicine Inpatient Consult Note   Name: Ellen Evans Date: 10/22/2015 MRN: [id number]  DOB: 13-Jun-1924  Referring Physician: Shaune Pollack, MD  Palliative Care consult requested for this 81 y.o. female for goals of medical therapy in patient with very poor oral intake due to advancing dementia with some delerium noted while she is being treated for presumed UTI.   TODAY'S DISCUSSIONS AND DECISIONS: 1.  Pt is not able to make decisions due to dementia.  2.  There are 4 adult children, with two involved and here.  A third daughter showed up while we talked the 3 of them agree with Hospice Home.  One daughter is POA (not HCPOA but POA--and there is a medical clause in this document that enables daughter to make healthcare related decisions).  At first, this document could not be found, but family did locate it recently and it was said to be 'in the car'.  I asked them to retrieve it for our records and they did so and it is now copied and placed in the pt's paper chart.  The POA daughter and the other two daughters here were in agreement with plans made for transfer to Hospice Home that arose during our conversation today.      3.  Pt did not seem to need much symptom management at first, but she is NOT eating anything at all.  Pt and family would not want a feeding tube or artifical fluids to maintain pt.  Before pt left by ambulance, she exhibited anxiety and restlessness and some distress. Her HR went up to 140 (due to anxiety --she was at this time fully alert but very confused and anxious).  She was given an oral dose of Roxanol just before she was transported.   4.  I revised pt's discharge med rec list and completed RXs for pt and placed all the appropriate documents in the DC packet for pt.     IMPRESSION: 1. Advanced dementia --she talks but does not really know what she is saying and it does not make any sense ---she doesn't really communicate meaningfully at all 2.  Malnutrition ---Her albumin is 3.2, but she has stopped eating now.  ---family offered her a sip of water and she 'got choked on it' today, so they are not wanting to be too aggressive with 'comfort foods'.  3. Hypokalemia 4. Anemia 5. UTI 6. HTN 7.  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  Bradycardia  9.  Tachycardia 10.  Urinary retention due to UTI   Dr. Molli Hazard Smith's note from 10/18/15 mentions 'MRI with small stroke on L parietal lobe".  This would be NON ACUTE.  I do not see an MRI result.       REVIEW OF SYSTEMS:  Patient is not able to provide ROS due to being lethargic and having dementia  SPIRITUAL SUPPORT SYSTEM: Yes --family.  SOCIAL HISTORY:  reports that she has never smoked. She does not have any smokeless tobacco history on file. She reports that she does not drink alcohol.  LEGAL DOCUMENTS:  I completed a portable DNR document and placed this in the paper chart.  CODE STATUS: DNR  PAST MEDICAL HISTORY: Past Medical History  Diagnosis Date  . Hypertension   . Vertigo     PAST SURGICAL HISTORY: History reviewed. No pertinent past surgical history.  ALLERGIES:  has No Known Allergies.  MEDICATIONS:  Current Facility-Administered Medications  Medication Dose Route Frequency Provider Last Rate Last Dose  . acetaminophen (TYLENOL)  suppository 650 mg  650 mg Rectal Q6H PRN Wyatt Hasteavid K Hower, MD      . LORazepam (ATIVAN) injection 1 mg  1 mg Intramuscular Q4H PRN Suan HalterMargaret F Kenna Kirn, MD      . morphine CONCENTRATE 10 MG/0.5ML oral solution 5 mg  5 mg Oral Q3H PRN Suan HalterMargaret F Chanell Nadeau, MD   5 mg at 10/22/15 1939    Vital Signs: BP 145/110 mmHg  Pulse 90  Temp(Src) 97.4 F (36.3 C) (Oral)  Resp 18  Ht 5\' 2"  (1.575 m)  Wt 52.617 kg (116 lb)  BMI 21.21 kg/m2  SpO2 96% Filed Weights   10/14/15 1626 10/14/15 2350  Weight: 52.164 kg (115 lb) 52.617 kg (116 lb)    Estimated body mass index is 21.21 kg/(m^2) as calculated from the following:   Height as of this  encounter: 5\' 2"  (1.575 m).   Weight as of this encounter: 52.617 kg (116 lb).  PERFORMANCE STATUS (ECOG) : 4 - Bedbound  PHYSICAL EXAM: When first seen by me she was lethargic to obtunded but VS were acceptably normal Eyes closed OP dry Neck w/o JVD or TM Hrt rrr no mgr with HR around 90 Lungs cta ant Abd soft and NT Ext no mottling or cyanosis as yet  LABS: CBC:    Component Value Date/Time   WBC 6.4 10/18/2015 0442   HGB 10.9* 10/18/2015 0442   HCT 32.3* 10/18/2015 0442   PLT 142* 10/18/2015 0442   MCV 93.5 10/18/2015 0442   NEUTROABS 8.4* 10/14/2015 1942   LYMPHSABS 1.1 10/14/2015 1942   MONOABS 1.0* 10/14/2015 1942   EOSABS 0.1 10/14/2015 1942   BASOSABS 0.1 10/14/2015 1942   Comprehensive Metabolic Panel:    Component Value Date/Time   NA 141 10/19/2015 0417   K 3.4* 10/19/2015 0417   CL 113* 10/19/2015 0417   CO2 22 10/19/2015 0417   BUN 17 10/19/2015 0417   CREATININE 0.76 10/19/2015 0417   GLUCOSE 123* 10/19/2015 0417   CALCIUM 9.6 10/19/2015 0417   AST 31 10/17/2015 0604   ALT 17 10/17/2015 0604   ALKPHOS 46 10/17/2015 0604   BILITOT 0.7 10/17/2015 0604   PROT 5.9* 10/17/2015 0604   ALBUMIN 3.2* 10/17/2015 0604     REFERRALS TO BE ORDERED:  [facility] for [facility] Home Admission   More than 50% of the visit was spent in counseling/coordination of care: Yes  Time Spent:  80 minutes

## 2015-10-22 NOTE — Care Management Important Message (Signed)
Important Message  Patient Details  Name: Ellen Evans MRN: [id number] Date of Birth: 13-Mar-1924   Medicare Important Message Given:  Yes  dc to hospice home    Eber HongGreene, Artyom Stencel R, RN 10/22/2015, 5:34 PM

## 2015-10-22 NOTE — Progress Notes (Signed)
New referral for the hospice home post palliative medicine consult on 81yo female who was admitted on 80.4.16 with altered mental status.  Patient has past medical history of hypertension and until this hospitalization was living at home by herself.  She received some PT and assistance from daughters who live locally.  During this hospitalization she was treated for cystitis.  Condition has worsened with continued decline.  Patient is sleeping most of the day and does not arouse with physical or verbal stimulation.  Daughters request no further treatment and request hospice home for their mother.  I met and discussed hospice home services with the patient's daughters who are in agreement with and request transition to the hospice home for EOL care.  Patient has no symptoms that currently are being managed at the hospital.  Daughters aware of hospice home criteria and bed status' with room and board charge if applicable.  Consents signed and referral information sent to referral intake.  Report to [facility].  EMS to transport patient at 1645.  Portable DNR to accompany patient to the hospice home.  Thank you for allowing participation in this patient's care.  Daughter's updated on transport time.

## 2015-10-22 NOTE — Clinical Social Work Note (Signed)
Pt is ready for discharge for to [facility]. Pt's daughter is agreeable and aware to discharge plan. Renea EeKara Marshall with [facility] made arrangements for [facility]. CSW prepared discharge packet. CSW is signing off as no further needs identified.   Dede QuerySarah Smiley Birr, MSW, LCSW Clinical Social Worker  [phone]

## 2015-10-22 NOTE — Discharge Instructions (Signed)
Regular diet. °Activity as tolerated. °Hospice care. °

## 2015-10-22 NOTE — Clinical Social Work Note (Signed)
Clinical Social Worker left a message on daughter's voicemail requesting a return phone call. CSW will address discharge plan with daughter when she calls back. CSW will continue to follow.   Ellen QuerySarah Yobani Evans, MSW, LCSW Clinical Social Worker  [phone]

## 2015-11-11 DEATH — deceased

## 2017-08-05 IMAGING — CR DG CHEST 2V
2 series · 2 of 2 positions shown · non-contrast
Comparison: Chest radiograph performed 09/12/2009

CLINICAL DATA: Acute onset of lethargy and decreased appetite.
Initial encounter.

EXAM:
CHEST  2 VIEW

[chest lat]
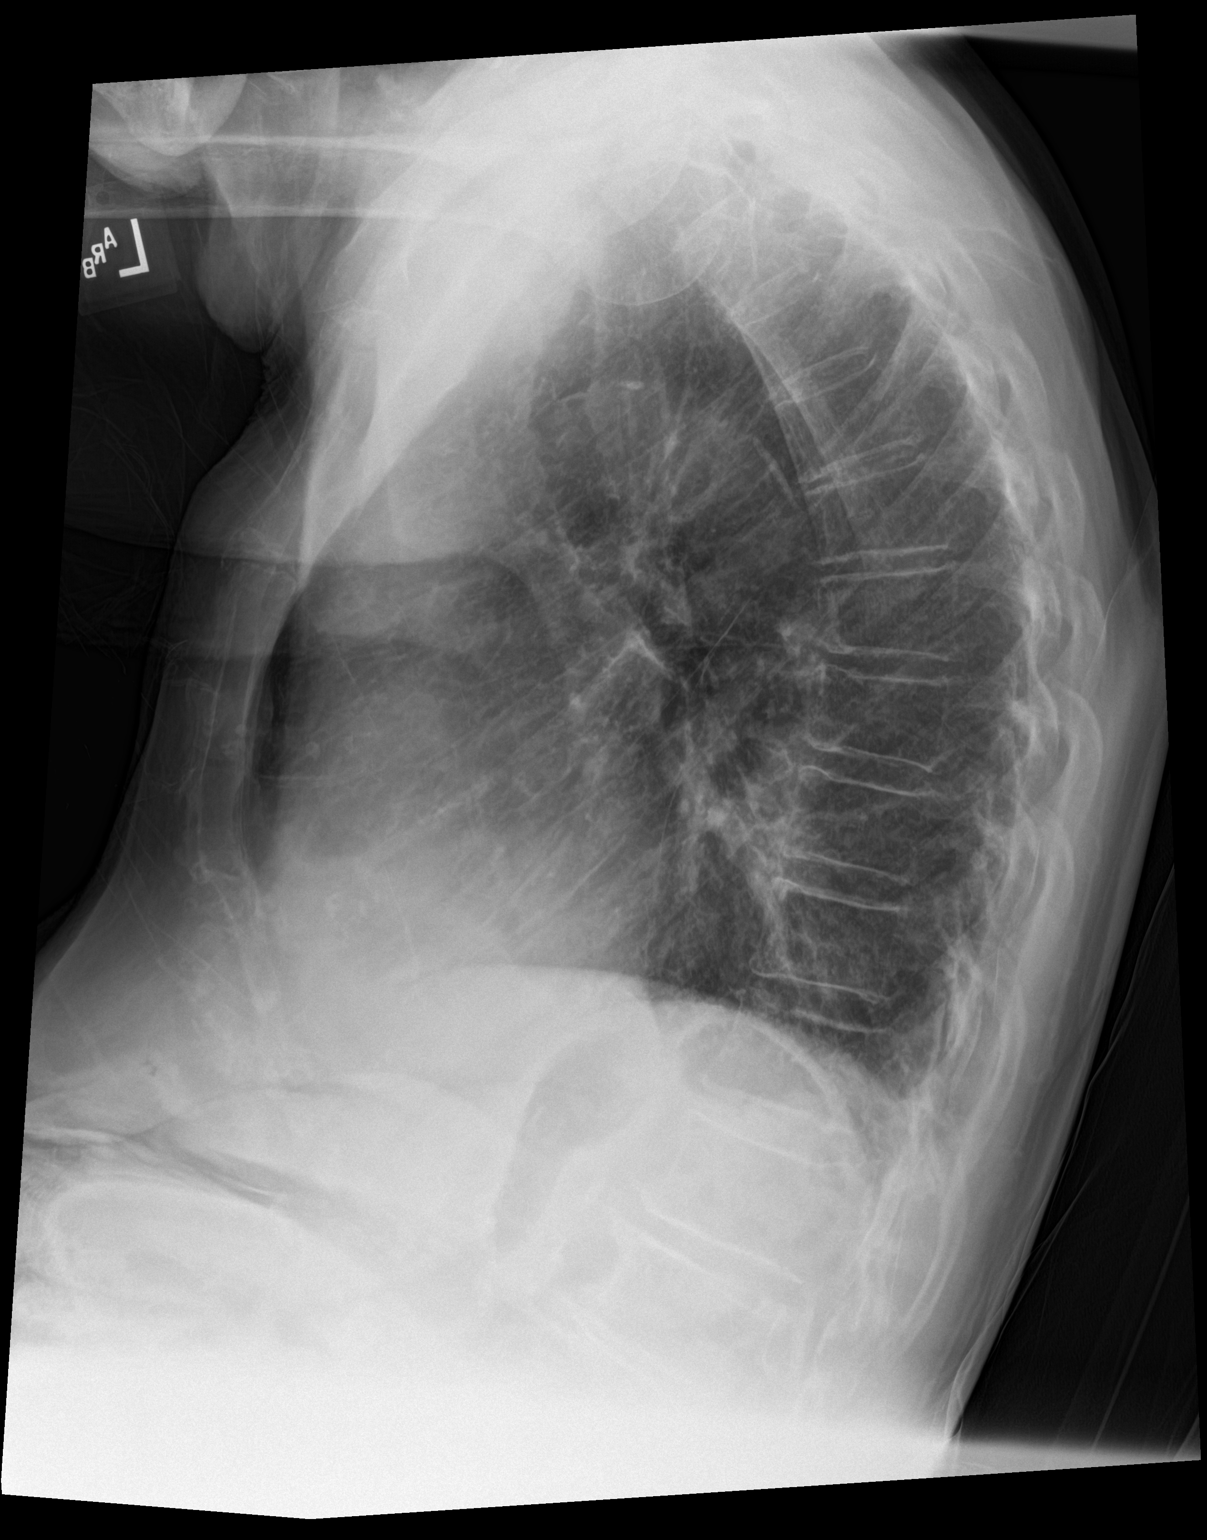

[chest ap]
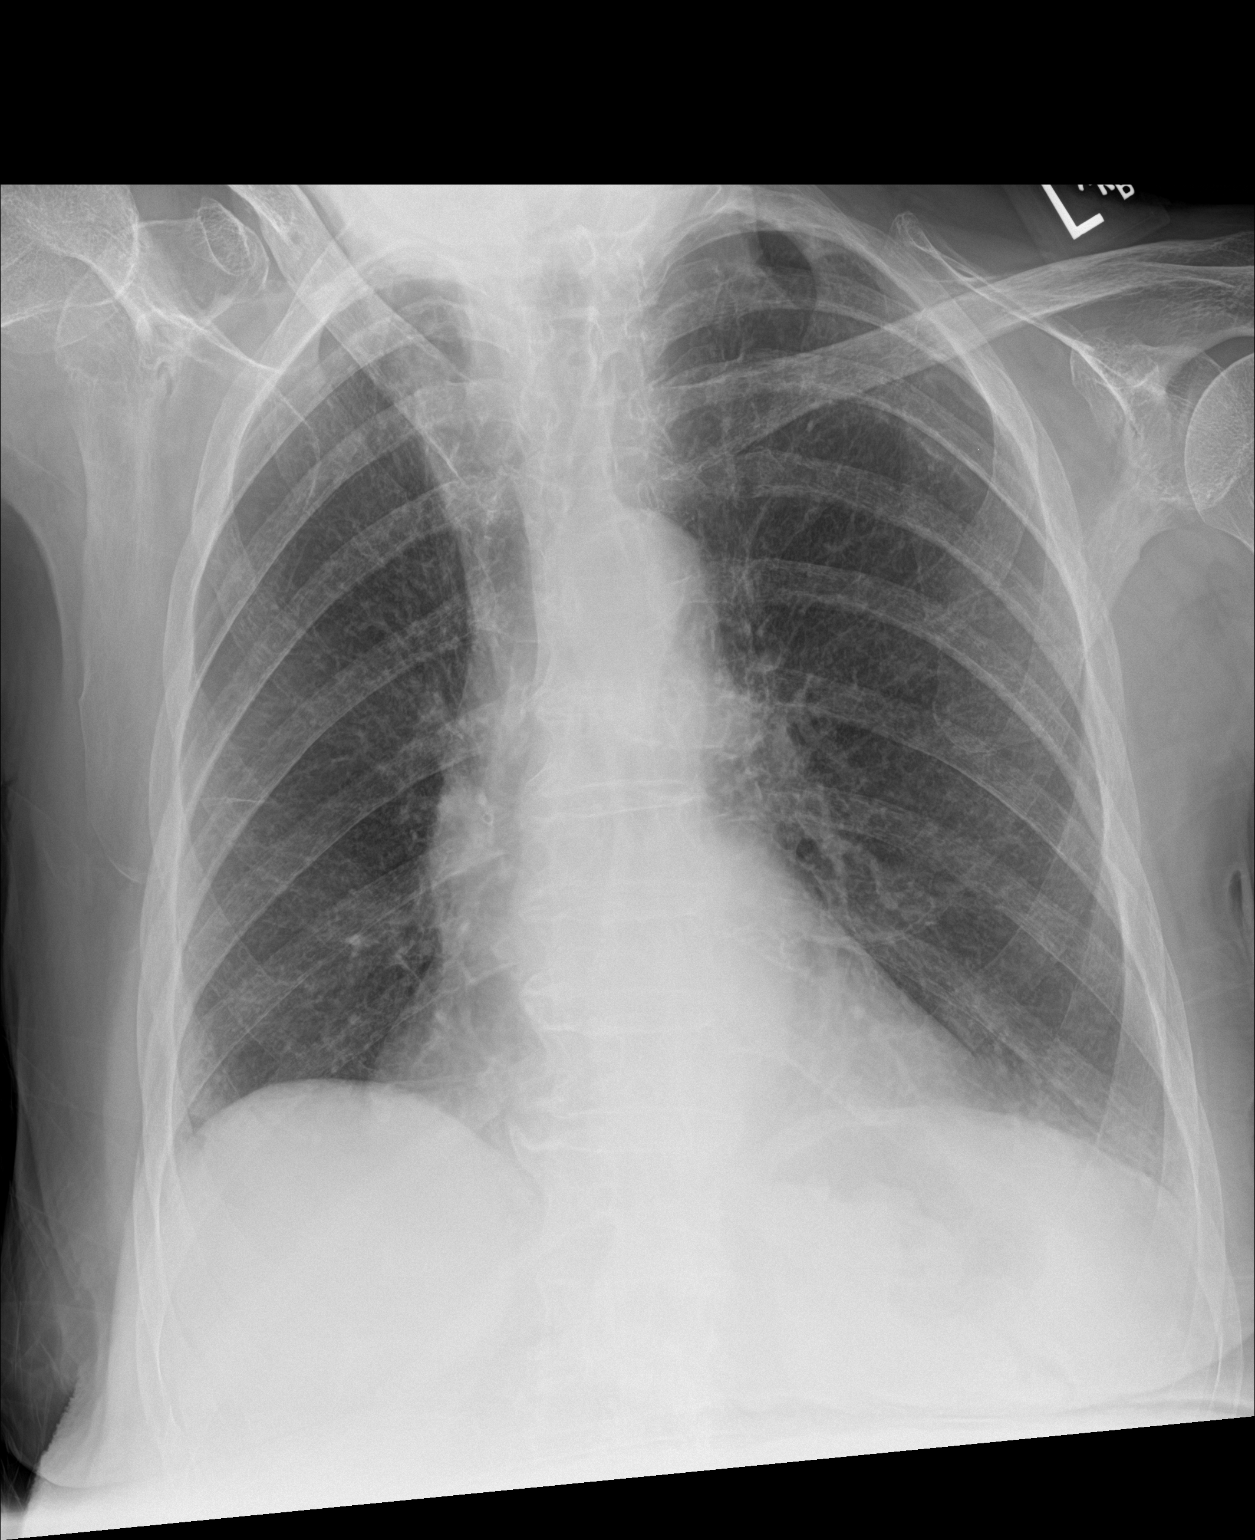

[2 of 2 positions shown; findings below may reference images not displayed]

FINDINGS: The lungs are well-aerated. Minimal bilateral atelectasis is noted.
There is no evidence of pleural effusion or pneumothorax.

The heart is borderline normal in size. No acute osseous
abnormalities are seen.
IMPRESSION: Minimal bilateral atelectasis noted.  Lungs otherwise clear.

## 2017-08-05 IMAGING — CT CT HEAD W/O CM
2 of 3 series · 16 of 30 positions shown, 18 images · non-contrast
Comparison: None.

CLINICAL DATA: Worsening confusion and poor mobility, acute onset.
Initial encounter.

EXAM:
CT HEAD WITHOUT CONTRAST
TECHNIQUE: Contiguous axial images were obtained from the base of the skull
through the vertex without intravenous contrast.

[Series 3: head wo · axial · 0.66mm/px · z∈[-164,-34]mm · 8 of 34 slices shown]
[im 4/34  brain]
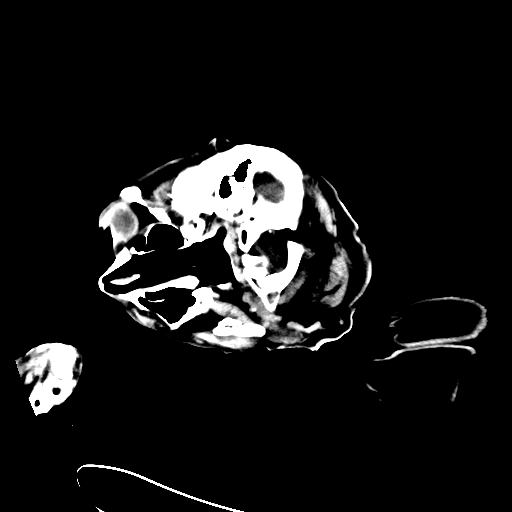
[im 8/34  brain]
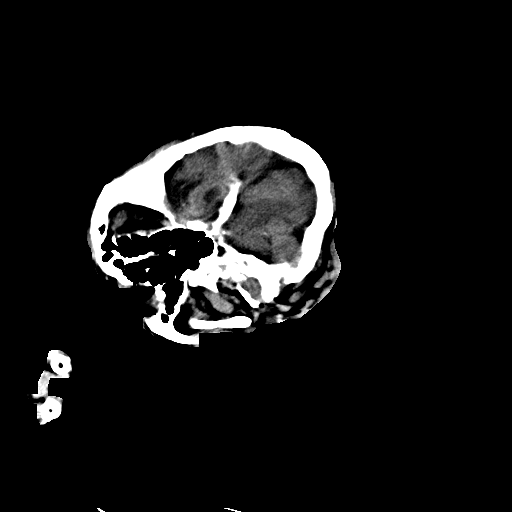
[im 12/34  brain]
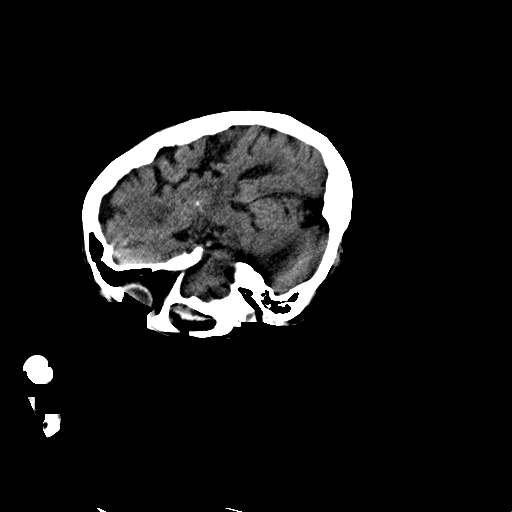
[im 15/34  brain]
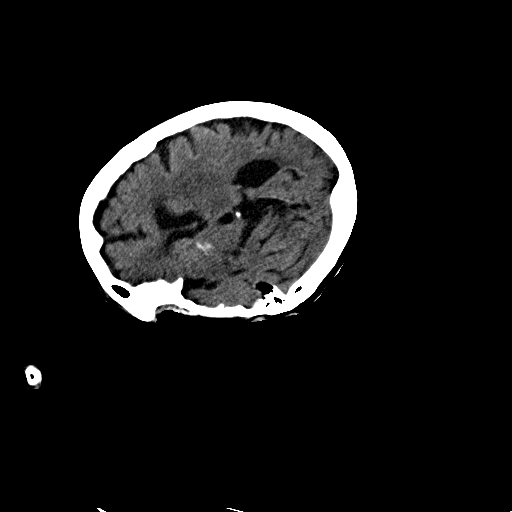
[im 19/34  brain]
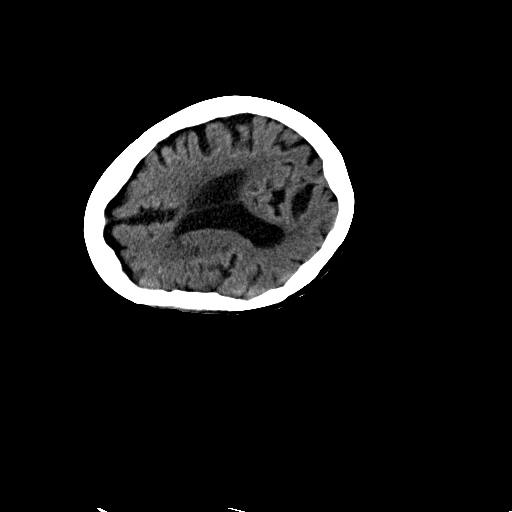
[im 23/34  brain]
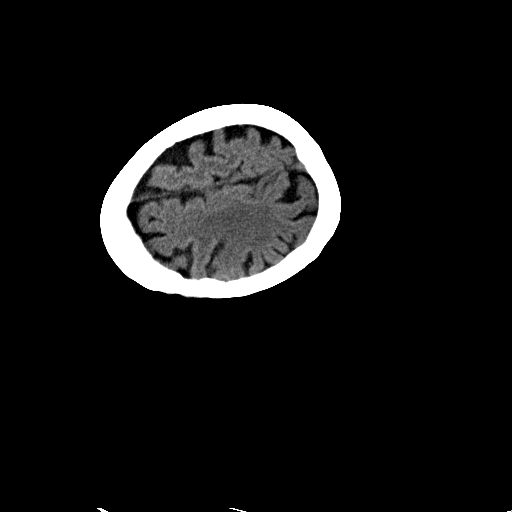
[im 26/34  brain]
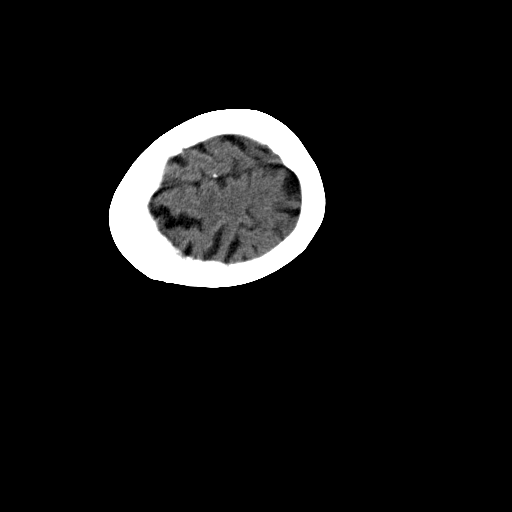
[im 30/34  brain]
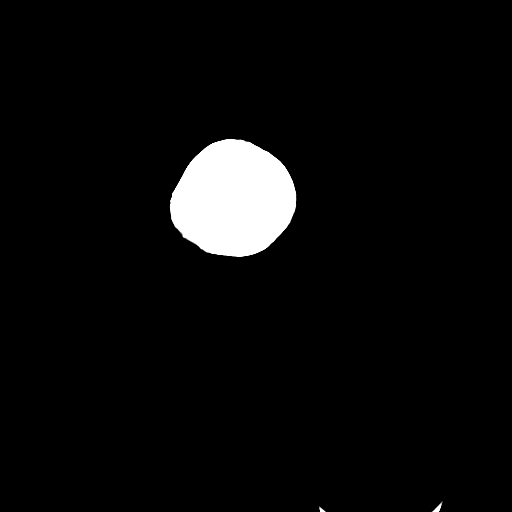

[Series 5: head wo2 · axial · 0.44mm/px · z∈[-82,+31]mm · 8 of 34 slices shown, 10 images]
[im 4/34  brain]
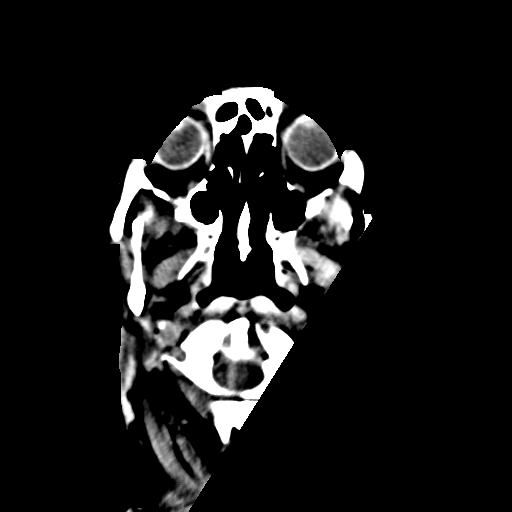
[im 4/34  bone]
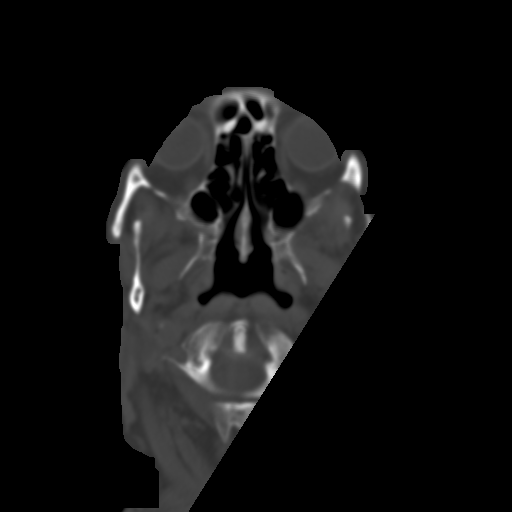
[im 8/34  brain]
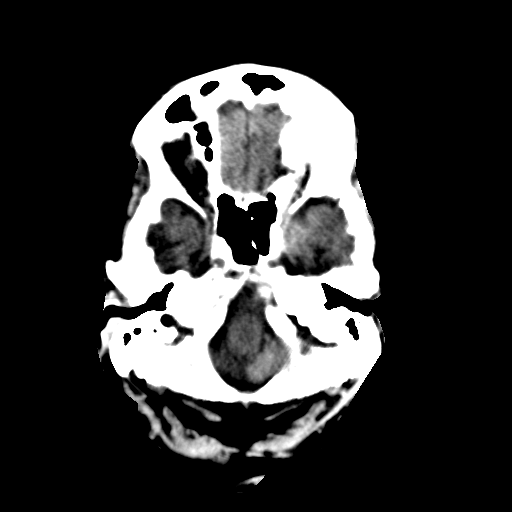
[im 12/34  brain]
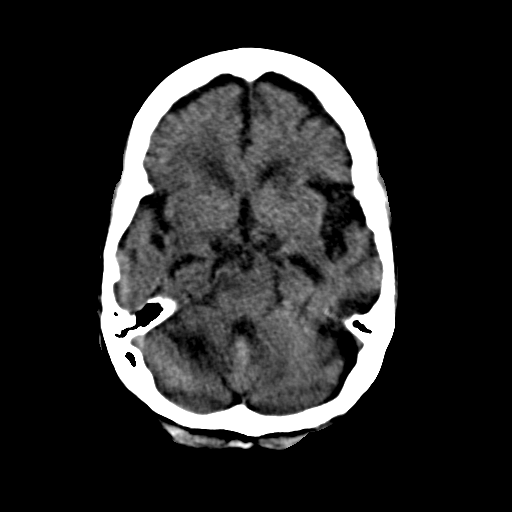
[im 15/34  brain]
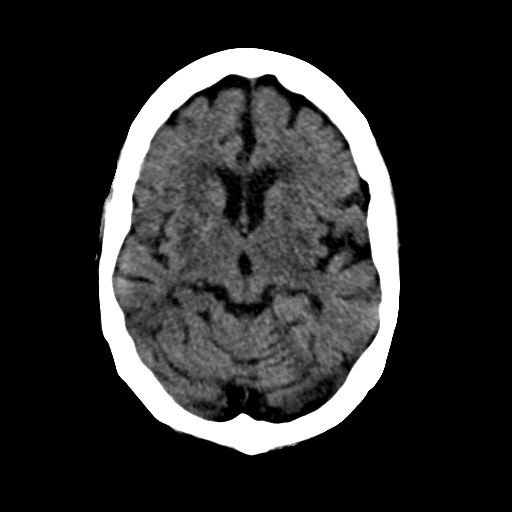
[im 19/34  brain]
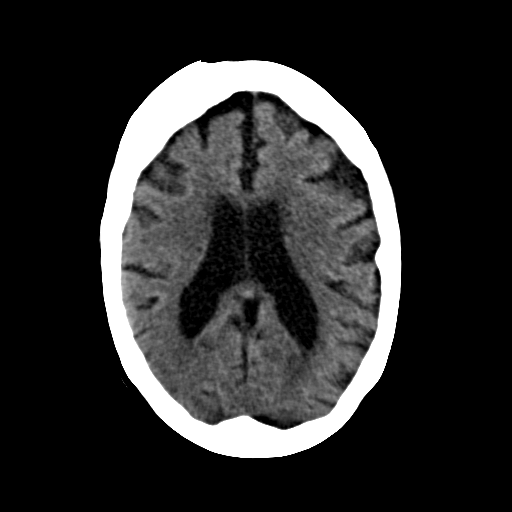
[im 19/34  bone]
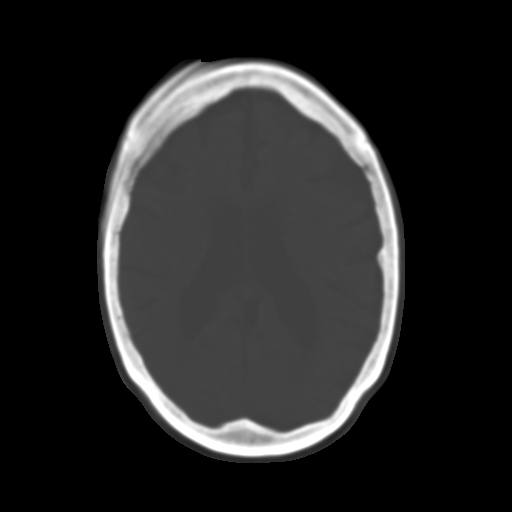
[im 23/34  brain]
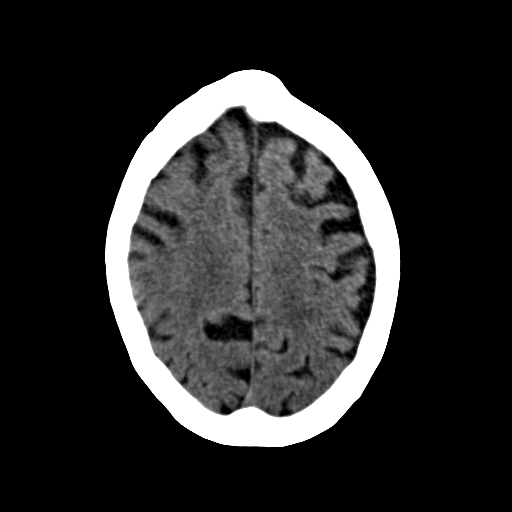
[im 26/34  brain]
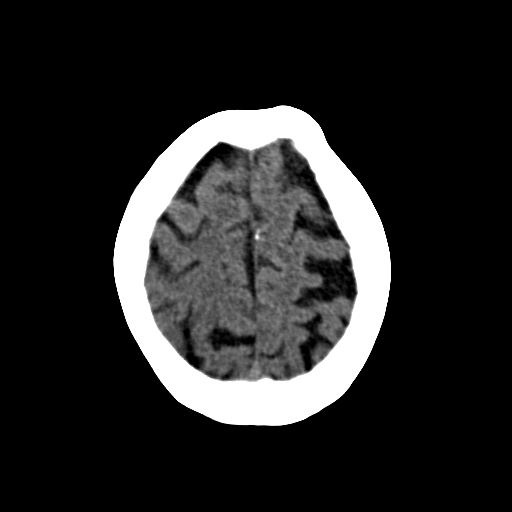
[im 30/34  brain]
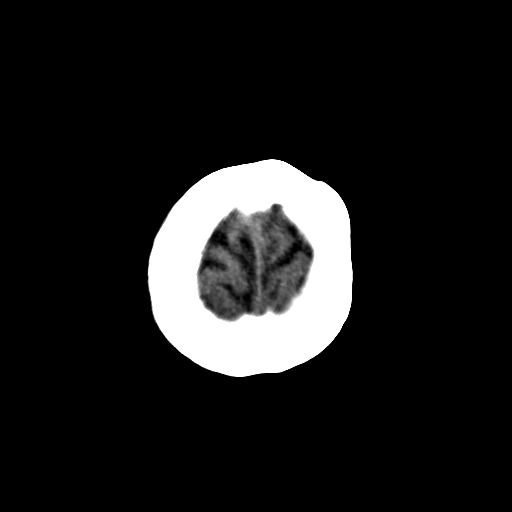

[16 of 30 positions shown; findings below may reference images not displayed]

FINDINGS: There is no evidence of acute infarction, mass lesion, or intra- or
extra-axial hemorrhage on CT.

Prominence of the ventricles and sulci reflects moderate cortical
volume loss. Mild cerebellar atrophy is noted. Scattered
periventricular and subcortical white matter change likely reflects
small vessel ischemic microangiopathy.

The brainstem and fourth ventricle are within normal limits. The
basal ganglia are unremarkable in appearance. The cerebral
hemispheres demonstrate grossly normal gray-white differentiation.
No mass effect or midline shift is seen.

There is no evidence of fracture; visualized osseous structures are
unremarkable in appearance. The visualized portions of the orbits
are within normal limits. The paranasal sinuses and mastoid air
cells are well-aerated. No significant soft tissue abnormalities are
seen.
IMPRESSION: 1. No acute intracranial pathology seen on CT.
2. Moderate cortical volume loss and scattered small vessel ischemic
microangiopathy.
